# Patient Record
Sex: Male | Born: 1980 | Race: White | Hispanic: No | Smoking: Current every day smoker
Health system: Southern US, Community
[De-identification: ages and names within clinical notes are randomized; demographics above are authoritative.]

## PROBLEM LIST (undated history)

## (undated) DIAGNOSIS — N200 Calculus of kidney: Secondary | ICD-10-CM

## (undated) DIAGNOSIS — F17290 Nicotine dependence, other tobacco product, uncomplicated: Secondary | ICD-10-CM

## (undated) DIAGNOSIS — K219 Gastro-esophageal reflux disease without esophagitis: Secondary | ICD-10-CM

## (undated) DIAGNOSIS — Z9109 Other allergy status, other than to drugs and biological substances: Secondary | ICD-10-CM

## (undated) HISTORY — PX: HERNIA REPAIR: SHX51

## (undated) HISTORY — PX: SINOSCOPY: SHX187

## (undated) HISTORY — PX: FOOT SURGERY: SHX648

---
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
## 2002-06-29 ENCOUNTER — Emergency Department (HOSPITAL_COMMUNITY): Admission: EM | Admit: 2002-06-29 | Discharge: 2002-06-29 | Payer: Self-pay

## 2002-08-07 ENCOUNTER — Encounter: Payer: Self-pay | Admitting: Emergency Medicine

## 2002-08-07 ENCOUNTER — Emergency Department (HOSPITAL_COMMUNITY): Admission: EM | Admit: 2002-08-07 | Discharge: 2002-08-08 | Payer: Self-pay

## 2002-10-09 ENCOUNTER — Emergency Department (HOSPITAL_COMMUNITY): Admission: EM | Admit: 2002-10-09 | Discharge: 2002-10-09 | Payer: Self-pay | Admitting: *Deleted

## 2002-10-31 ENCOUNTER — Emergency Department (HOSPITAL_COMMUNITY): Admission: EM | Admit: 2002-10-31 | Discharge: 2002-10-31 | Payer: Self-pay | Admitting: Emergency Medicine

## 2002-11-04 ENCOUNTER — Emergency Department (HOSPITAL_COMMUNITY): Admission: EM | Admit: 2002-11-04 | Discharge: 2002-11-04 | Payer: Self-pay | Admitting: Emergency Medicine

## 2002-11-07 ENCOUNTER — Emergency Department (HOSPITAL_COMMUNITY): Admission: EM | Admit: 2002-11-07 | Discharge: 2002-11-07 | Payer: Self-pay | Admitting: Emergency Medicine

## 2004-07-11 ENCOUNTER — Emergency Department (HOSPITAL_COMMUNITY): Admission: EM | Admit: 2004-07-11 | Discharge: 2004-07-11 | Payer: Self-pay | Admitting: *Deleted

## 2005-11-27 HISTORY — PX: ABDOMINAL SURGERY: SHX537

## 2006-11-22 ENCOUNTER — Emergency Department (HOSPITAL_COMMUNITY): Admission: EM | Admit: 2006-11-22 | Discharge: 2006-11-22 | Payer: Self-pay | Admitting: Emergency Medicine

## 2007-09-04 ENCOUNTER — Inpatient Hospital Stay (HOSPITAL_COMMUNITY): Admission: EM | Admit: 2007-09-04 | Discharge: 2007-09-06 | Payer: Self-pay | Admitting: Emergency Medicine

## 2008-07-03 ENCOUNTER — Ambulatory Visit (HOSPITAL_COMMUNITY): Admission: EM | Admit: 2008-07-03 | Discharge: 2008-07-03 | Payer: Self-pay | Admitting: Emergency Medicine

## 2008-07-06 ENCOUNTER — Emergency Department (HOSPITAL_COMMUNITY): Admission: EM | Admit: 2008-07-06 | Discharge: 2008-07-06 | Payer: Self-pay | Admitting: Emergency Medicine

## 2009-01-27 ENCOUNTER — Emergency Department (HOSPITAL_COMMUNITY): Admission: EM | Admit: 2009-01-27 | Discharge: 2009-01-27 | Payer: Self-pay | Admitting: Emergency Medicine

## 2010-10-25 ENCOUNTER — Emergency Department (HOSPITAL_COMMUNITY): Admission: EM | Admit: 2010-10-25 | Discharge: 2010-10-25 | Payer: Self-pay | Admitting: Emergency Medicine

## 2011-02-26 ENCOUNTER — Emergency Department (HOSPITAL_COMMUNITY)
Admission: EM | Admit: 2011-02-26 | Discharge: 2011-02-26 | Disposition: A | Discharge status: Home / Self Care | Payer: Medicaid Other | Attending: Emergency Medicine | Admitting: Emergency Medicine

## 2011-02-26 ENCOUNTER — Emergency Department (HOSPITAL_COMMUNITY): Payer: Medicaid Other

## 2011-02-26 DIAGNOSIS — S62639B Displaced fracture of distal phalanx of unspecified finger, initial encounter for open fracture: Secondary | ICD-10-CM | POA: Insufficient documentation

## 2011-02-26 DIAGNOSIS — W540XXA Bitten by dog, initial encounter: Secondary | ICD-10-CM | POA: Insufficient documentation

## 2011-02-26 DIAGNOSIS — S61409A Unspecified open wound of unspecified hand, initial encounter: Secondary | ICD-10-CM | POA: Insufficient documentation

## 2011-02-26 DIAGNOSIS — Z23 Encounter for immunization: Secondary | ICD-10-CM | POA: Insufficient documentation

## 2011-03-01 ENCOUNTER — Encounter (HOSPITAL_COMMUNITY): Payer: Medicaid Other

## 2011-03-05 ENCOUNTER — Encounter (HOSPITAL_COMMUNITY): Payer: Medicaid Other

## 2011-03-12 ENCOUNTER — Ambulatory Visit (HOSPITAL_COMMUNITY): Payer: Self-pay

## 2011-04-11 NOTE — Op Note (Signed)
NAME:  Tom Humphrey, Tom Humphrey NO.:  [id number]   MEDICAL RECORD NO.:  [id number]          PATIENT TYPE:  EMS   LOCATION:  ED                           FACILITY:  [facility]   PHYSICIAN:  Jamison Neighbor, M.D.  DATE OF BIRTH:  01/15/1981   DATE OF PROCEDURE:  07/03/2008  DATE OF DISCHARGE:                               OPERATIVE REPORT   SERVICE:  Urology.   PREOPERATIVE DIAGNOSIS:  Possible right testicular torsion.   POSTOPERATIVE DIAGNOSIS:  1. Intermittent torsion.  2. Reactive right hydrocele.   PROCEDURE:  Right scrotal exploration, right hydrocelectomy, bilateral  orchiopexy.   SURGEON:  Jamison Neighbor, M.D.   ANESTHESIA:  General.   COMPLICATIONS:  None.   DRAINS:  A Penrose drain in the right hemiscrotum.   HISTORY:  This 30 year old male has had 3-4 days of pain in the right-  hand side.  The patient presented to the emergency room and underwent a  scrotal ultrasound that showed a reactive hydrocele but did show  diminished blood flow of the right-hand side.  It was felt that this  might represent testicular torsion.  The epididymis did not appear to be  palpably posterior but it was very difficult to tell due to the reactive  hydrocele.  Given the presence of pain and the abnormalities of the tes  on ultrasound it seemed appropriate to perform an exploration.  The  patient understands that he may end up with an orchiectomy and it also  possible he will not have torsion been have another problem such as  epididymitis; full informed consent was obtained.   PROCEDURE IN DETAIL:  After successful induction of general anesthesia  the patient was placed in the supine position and prepped with Betadine  and draped in the usual sterile fashion.  Incision was made in the right  hemiscrotum and was carried out through the Darto's layer until the  tunica vaginalis was identified.  Tunica vaginalis was opened and the  patient was found to have a significant  hydrocele.  This actually may be  due to a patent tunica vaginalis as the finger could be placed up and  through the internal ring.  There were no signs of an active hernia.  The testicle did appear somewhat blue although it clearly was viable and  it is does appear that the patient may have had intermittent torsion.  The testicle was placed in the appropriate position.  The hydrocele sac  was turned inside out and it was oversewn with 2-0 Vicryl.  An  orchiopexy was performed with 3-0 silk pexing at three separate  locations.  The Darto's layer was then closed with a running suture of 2-  0 Vicryl and the skin was closed with 3-0 chromic.  The patient was left  with two quarter inch Penrose drains exiting out through a single small  stab incision and this will be left in place until all swelling is gone.  A small incision was made on the left hemiscrotum and the tunica  vaginalis was opened.  There really was not much in the way of a  hydrocele.  The testicle was identified and was normal.  Orchiopexy was  also performed on this side because there was somewhat of a bell clapper  testicle and it was felt that it would be safer to make sure that side  did not twist either.  This was also  closed with 2-0 Vicryl and 3-0 chromic.  Collodion was placed on both  incisions.  The patient tolerated the procedure well and was taken to  the recovery room in good condition.  He will be sent home with  doxycycline and Tylox and a scrotal support and ice and return to see me  in followup.      Jamison Neighbor, M.D.  Electronically Signed     RJE/MEDQ  D:  07/03/2008  T:  07/04/2008  Job:  [id number]
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
## 2011-07-14 ENCOUNTER — Inpatient Hospital Stay (INDEPENDENT_AMBULATORY_CARE_PROVIDER_SITE_OTHER)
Admission: RE | Admit: 2011-07-14 | Discharge: 2011-07-14 | Disposition: A | Discharge status: Home / Self Care | Payer: Medicaid Other | Source: Ambulatory Visit | Attending: Family Medicine | Admitting: Family Medicine

## 2011-07-14 ENCOUNTER — Ambulatory Visit (INDEPENDENT_AMBULATORY_CARE_PROVIDER_SITE_OTHER): Payer: Medicaid Other

## 2011-07-14 DIAGNOSIS — IMO0002 Reserved for concepts with insufficient information to code with codable children: Secondary | ICD-10-CM

## 2011-07-14 DIAGNOSIS — S058X9A Other injuries of unspecified eye and orbit, initial encounter: Secondary | ICD-10-CM

## 2011-08-25 LAB — URINALYSIS, ROUTINE W REFLEX MICROSCOPIC
Hgb urine dipstick: NEGATIVE
Protein, ur: NEGATIVE
Specific Gravity, Urine: 1.023
Urobilinogen, UA: 0.2

## 2011-08-25 LAB — CBC
HCT: 41.8
Hemoglobin: 14.4
RBC: 4.74

## 2011-08-25 LAB — BASIC METABOLIC PANEL
CO2: 28
Calcium: 9.2
Creatinine, Ser: 1.07
Glucose, Bld: 84

## 2011-08-25 LAB — DIFFERENTIAL
Basophils Absolute: 0
Basophils Relative: 0
Monocytes Absolute: 1.3 — ABNORMAL HIGH
Monocytes Relative: 11
Neutro Abs: 7
Neutrophils Relative %: 59

## 2011-09-07 LAB — DIFFERENTIAL
Eosinophils Absolute: 0.1
Neutro Abs: 7.8 — ABNORMAL HIGH

## 2011-09-07 LAB — CBC
HCT: 41.6
MCHC: 34.7
Platelets: 223
RBC: 4.78
RDW: 12.9

## 2011-09-07 LAB — CULTURE, BLOOD (ROUTINE X 2)

## 2011-09-07 LAB — BASIC METABOLIC PANEL
BUN: 10
CO2: 26
GFR calc Af Amer: >60
Sodium: 138

## 2011-10-02 ENCOUNTER — Emergency Department (HOSPITAL_COMMUNITY)
Admission: EM | Admit: 2011-10-02 | Discharge: 2011-10-02 | Discharge status: Against Medical Advice | Payer: Medicaid Other | Attending: Emergency Medicine | Admitting: Emergency Medicine

## 2011-10-02 DIAGNOSIS — R109 Unspecified abdominal pain: Secondary | ICD-10-CM | POA: Insufficient documentation

## 2011-11-02 ENCOUNTER — Other Ambulatory Visit: Payer: Self-pay | Admitting: Family Medicine

## 2011-11-02 DIAGNOSIS — N509 Disorder of male genital organs, unspecified: Secondary | ICD-10-CM

## 2011-11-06 ENCOUNTER — Ambulatory Visit
Admission: RE | Admit: 2011-11-06 | Discharge: 2011-11-06 | Disposition: A | Discharge status: Home / Self Care | Payer: Medicaid Other | Source: Ambulatory Visit | Attending: Family Medicine | Admitting: Family Medicine

## 2011-11-06 DIAGNOSIS — N509 Disorder of male genital organs, unspecified: Secondary | ICD-10-CM

## 2011-11-10 ENCOUNTER — Emergency Department (HOSPITAL_COMMUNITY)
Admission: EM | Admit: 2011-11-10 | Discharge: 2011-11-10 | Disposition: A | Discharge status: Home / Self Care | Payer: Medicaid Other | Attending: Emergency Medicine | Admitting: Emergency Medicine

## 2011-11-10 DIAGNOSIS — K4090 Unilateral inguinal hernia, without obstruction or gangrene, not specified as recurrent: Secondary | ICD-10-CM | POA: Insufficient documentation

## 2011-11-10 DIAGNOSIS — F17200 Nicotine dependence, unspecified, uncomplicated: Secondary | ICD-10-CM | POA: Insufficient documentation

## 2011-11-10 LAB — DIFFERENTIAL
Basophils Absolute: 0 10*3/uL (ref 0.0–0.1)
Basophils Relative: 0 % (ref 0–1)
Eosinophils Absolute: 0.5 10*3/uL (ref 0.0–0.7)
Eosinophils Relative: 4 % (ref 0–5)
Lymphocytes Relative: 35 % (ref 12–46)

## 2011-11-10 LAB — CBC
HCT: 42.6 % (ref 39.0–52.0)
MCHC: 35.7 g/dL (ref 30.0–36.0)
MCV: 86.9 fL (ref 78.0–100.0)
Platelets: 237 10*3/uL (ref 150–400)
RBC: 4.9 MIL/uL (ref 4.22–5.81)
WBC: 11.9 10*3/uL — ABNORMAL HIGH (ref 4.0–10.5)

## 2011-11-10 LAB — BASIC METABOLIC PANEL
CO2: 27 mEq/L (ref 19–32)
Chloride: 100 mEq/L (ref 96–112)
GFR calc non Af Amer: >90 mL/min (ref >90)
Glucose, Bld: 97 mg/dL (ref 70–99)
Potassium: 3.4 mEq/L — ABNORMAL LOW (ref 3.5–5.1)
Sodium: 136 mEq/L (ref 135–145)

## 2011-11-10 MED ORDER — HYDROCODONE-ACETAMINOPHEN 5-500 MG PO TABS: 1.0000 | ORAL_TABLET | Freq: Four times a day (QID) | ORAL | Status: AC | PRN

## 2011-11-10 MED ORDER — SODIUM CHLORIDE 0.9 % IV BOLUS (SEPSIS)
1000.0000 mL | Freq: Once | INTRAVENOUS | Status: AC
  Administered 2011-11-10: 1000 mL via INTRAVENOUS

## 2011-11-10 MED ORDER — ONDANSETRON HCL 4 MG/2ML IJ SOLN
4.0000 mg | Freq: Once | INTRAMUSCULAR | Status: AC
  Administered 2011-11-10: 4 mg via INTRAVENOUS
  Filled 2011-11-10: qty 2

## 2011-11-10 MED ORDER — HYDROMORPHONE HCL PF 1 MG/ML IJ SOLN
1.0000 mg | Freq: Once | INTRAMUSCULAR | Status: AC
  Administered 2011-11-10: 1 mg via INTRAVENOUS
  Filled 2011-11-10: qty 1

## 2011-11-10 MED ORDER — LORAZEPAM 1 MG PO TABS
1.0000 mg | ORAL_TABLET | Freq: Once | ORAL | Status: AC
  Administered 2011-11-10: 1 mg via ORAL
  Filled 2011-11-10: qty 1

## 2011-11-10 NOTE — ED Provider Notes (Signed)
History     CSN: [id number] Arrival date & time: 11/10/2011  4:09 AM   First MD Initiated Contact with Patient 11/10/11 0404      Chief Complaint  Patient presents with  . Groin Swelling    (Consider location/radiation/quality/duration/timing/severity/associated sxs/prior treatment) The history is provided by the patient.   onset tonight after going to bed. Location right testicle. Quality is sharp pain. Severe pain. No radiation. Constant since onset. Was evaluated for similar symptoms are not as severe about 4 days ago for an ultrasound that was reported to him as normal. He has a history of torsion 2009 the cause him elevated concerned tonight.  No fevers or chills. No nausea or vomiting. No recalled trauma. No dysuria, urgency or frequency. No hematuria.  History reviewed. No pertinent past medical history.  History reviewed. No pertinent past surgical history.  History reviewed. No pertinent family history.  History  Substance Use Topics  . Smoking status: Current Some Day Smoker  . Smokeless tobacco: Not on file  . Alcohol Use: No      Review of Systems  Constitutional: Negative for fever and chills.  HENT: Negative for sore throat, neck pain and neck stiffness.   Eyes: Negative for pain.  Respiratory: Negative for shortness of breath.   Cardiovascular: Negative for chest pain.  Gastrointestinal: Negative for vomiting and abdominal pain.  Genitourinary: Positive for scrotal swelling. Negative for dysuria.  Musculoskeletal: Negative for back pain.  Skin: Negative for rash.  Neurological: Negative for headaches.  All other systems reviewed and are negative.    Allergies  Codeine  Home Medications  No current outpatient prescriptions on file.  BP 108/70  Pulse 78  Temp(Src) 97.8 F (36.6 C) (Oral)  Resp 16  Ht 6\' 2"  (1.88 m)  Wt 220 lb (99.791 kg)  BMI 28.25 kg/m2  SpO2 97%  Physical Exam  Constitutional: He is oriented to person, place, and time.  He appears well-developed and well-nourished.  HENT:  Head: Normocephalic and atraumatic.  Eyes: Conjunctivae and EOM are normal. Pupils are equal, round, and reactive to light.  Neck: Trachea normal. Neck supple. No thyromegaly present.  Cardiovascular: Normal rate, regular rhythm, S1 normal, S2 normal and normal pulses.     No systolic murmur is present   No diastolic murmur is present  Pulses:      Radial pulses are 2+ on the right side, and 2+ on the left side.  Pulmonary/Chest: Effort normal and breath sounds normal. He has no wheezes. He has no rhonchi. He has no rales. He exhibits no tenderness.  Abdominal: Soft. Normal appearance and bowel sounds are normal. There is no tenderness. There is no CVA tenderness and negative Murphy's sign.  Genitourinary: Penis normal.       Right scrotum grossly enlarged with hernia present. Tender to palpation and unable to reduce it on initial evaluation.  Neurological: He is alert and oriented to person, place, and time. He has normal strength. No cranial nerve deficit or sensory deficit. GCS eye subscore is 4. GCS verbal subscore is 5. GCS motor subscore is 6.  Skin: Skin is warm and dry. No rash noted. He is not diaphoretic.  Psychiatric: His speech is normal.       Cooperative and appropriate    ED Course  Procedures (including critical care time)  Labs Reviewed  CBC - Abnormal; Notable for the following:    WBC 11.9 (*)    All other components within normal limits  DIFFERENTIAL -  Abnormal; Notable for the following:    Lymphs Abs 4.2 (*)    Monocytes Absolute 1.1 (*)    All other components within normal limits  BASIC METABOLIC PANEL  LACTIC ACID, PLASMA   Hernia attempted to be reduced without success.   5:08 AM case d/w DR Leticia Penna on call for [facility], recs ativan in addition to pain control, continue trendelenburg and measures to relax patient.  He will be in to evaluate patient. Lactate pending.   6:43 AM after dilaudid x 2, ativan,  trendelenburg and labs reviewed. I was able to reduce the hernia with pain improved.   MDM   Right inguinal hernia, initially unable to reduce. After serial evaluations, pain medications as above and Ativan hernia in reducing.  Plan outpatient followup with Dr. Suzette Battiest, pain medications as needed, hernia precautions and instructions to stay well hydrated and avoid constipation.      Tom Nielsen, MD 11/10/11 [id number]

## 2011-11-10 NOTE — ED Notes (Signed)
Pt c/o pain and swelling to right testicle since last night, states had surgery to same in 2009

## 2011-12-20 ENCOUNTER — Emergency Department (HOSPITAL_COMMUNITY)
Admission: EM | Admit: 2011-12-20 | Discharge: 2011-12-20 | Disposition: A | Discharge status: Home / Self Care | Payer: Medicaid Other | Attending: Emergency Medicine | Admitting: Emergency Medicine

## 2011-12-20 ENCOUNTER — Encounter (HOSPITAL_COMMUNITY): Payer: Self-pay

## 2011-12-20 DIAGNOSIS — R109 Unspecified abdominal pain: Secondary | ICD-10-CM | POA: Insufficient documentation

## 2011-12-20 DIAGNOSIS — F17200 Nicotine dependence, unspecified, uncomplicated: Secondary | ICD-10-CM | POA: Insufficient documentation

## 2011-12-20 DIAGNOSIS — K469 Unspecified abdominal hernia without obstruction or gangrene: Secondary | ICD-10-CM | POA: Insufficient documentation

## 2011-12-20 LAB — BASIC METABOLIC PANEL
CO2: 28 mEq/L (ref 19–32)
Chloride: 104 mEq/L (ref 96–112)
Glucose, Bld: 102 mg/dL — ABNORMAL HIGH (ref 70–99)
Potassium: 4 mEq/L (ref 3.5–5.1)
Sodium: 139 mEq/L (ref 135–145)

## 2011-12-20 LAB — CBC
HCT: 44.7 % (ref 39.0–52.0)
Hemoglobin: 15.2 g/dL (ref 13.0–17.0)
RBC: 5.21 MIL/uL (ref 4.22–5.81)

## 2011-12-20 MED ORDER — HYDROMORPHONE HCL PF 1 MG/ML IJ SOLN
1.0000 mg | Freq: Once | INTRAMUSCULAR | Status: AC
  Administered 2011-12-20: 1 mg via INTRAVENOUS
  Filled 2011-12-20: qty 1

## 2011-12-20 MED ORDER — ONDANSETRON HCL 4 MG/2ML IJ SOLN
4.0000 mg | Freq: Once | INTRAMUSCULAR | Status: AC
  Administered 2011-12-20: 4 mg via INTRAVENOUS
  Filled 2011-12-20: qty 2

## 2011-12-20 MED ORDER — HYDROCODONE-ACETAMINOPHEN 5-325 MG PO TABS: 1.0000 | ORAL_TABLET | ORAL | Status: AC | PRN

## 2011-12-20 MED ORDER — OXYCODONE-ACETAMINOPHEN 5-325 MG PO TABS: 2.0000 | ORAL_TABLET | ORAL | Status: AC | PRN

## 2011-12-20 MED ORDER — SODIUM CHLORIDE 0.9 % IV SOLN
Freq: Once | INTRAVENOUS | Status: AC
  Administered 2011-12-20: 15:00:00 via INTRAVENOUS

## 2011-12-20 NOTE — ED Provider Notes (Addendum)
History     CSN: [id number]  Arrival date & time 12/20/11  1228   First MD Initiated Contact with Patient 12/20/11 1343      Chief Complaint  Patient presents with  . Abdominal Pain    (Consider location/radiation/quality/duration/timing/severity/associated sxs/prior treatment) HPI Tom Humphrey is a 31 y.o. male who presents to the Emergency Department complaining of right sided hernia. Patient has been seen by Dr. Leticia Penna, surgeon and is scheduled for elective surgery 01/17/12. Was coughing last night with an increase in scrotal swelling and pain.  History reviewed. No pertinent past medical history.  Past Surgical History  Procedure Date  . Hand surgery   . Foot surgery   . Abdominal surgery     No family history on file.  History  Substance Use Topics  . Smoking status: Current Some Day Smoker  . Smokeless tobacco: Not on file  . Alcohol Use: No      Review of Systems 10 Systems reviewed and are negative for acute change except as noted in the HPI. Allergies  Codeine  Home Medications  No current outpatient prescriptions on file.  BP 110/76  Pulse 69  Temp(Src) 97.6 F (36.4 C) (Oral)  Resp 20  Ht 6\' 3"  (1.905 m)  Wt 220 lb (99.791 kg)  BMI 27.50 kg/m2  SpO2 99%  Physical Exam  Nursing note and vitals reviewed. Constitutional: He is oriented to person, place, and time. He appears well-developed and well-nourished. He appears distressed.  HENT:  Head: Normocephalic.  Right Ear: External ear normal.  Left Ear: External ear normal.  Mouth/Throat: Oropharynx is clear and moist.  Eyes: EOM are normal. Pupils are equal, round, and reactive to light.  Neck: Normal range of motion. Neck supple.  Cardiovascular: Normal rate, normal heart sounds and intact distal pulses.   Pulmonary/Chest: Effort normal and breath sounds normal.  Abdominal: Soft. Bowel sounds are normal.  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  Musculoskeletal: Normal range of motion.  Neurological: He is alert and oriented to person, place, and time. He has normal reflexes.  Skin: Skin is warm and dry.    ED Course  Procedures (including critical care time) Results for orders placed during the hospital encounter of 12/20/11  CBC      Component Value Range   WBC 9.3  4.0 - 10.5 (K/uL)   RBC 5.21  4.22 - 5.81 (MIL/uL)   Hemoglobin 15.2  13.0 - 17.0 (g/dL)   HCT 40.9  81.1 - 91.4 (%)   MCV 85.8  78.0 - 100.0 (fL)   MCH 29.2  26.0 - 34.0 (pg)   MCHC 34.0  30.0 - 36.0 (g/dL)   RDW 78.2  95.6 - 21.3 (%)   Platelets 254  150 - 400 (K/uL)  BASIC METABOLIC PANEL      Component Value Range   Sodium 139  135 - 145 (mEq/L)   Potassium 4.0  3.5 - 5.1 (mEq/L)   Chloride 104  96 - 112 (mEq/L)   CO2 28  19 - 32 (mEq/L)   Glucose, Bld 102 (*) 70 - 99 (mg/dL)   BUN 10  6 - 23 (mg/dL)   Creatinine, Ser 0.86  0.50 - 1.35 (mg/dL)   Calcium 9.6  8.4 - 57.8 (mg/dL)   GFR calc non Af Amer >90  >90 (mL/min)   GFR  calc Af Amer >90  >90 (mL/min)   1512 Spoke with Dr. Lovell Sheehan. He advised we could contact Dr. Caesar Bookman and the patient can call the office for arrangement of earlier surgical appointment. 1540 Spoke with Dr. Caesar Bookman who advised patient can call for earlier surgical date.   MDM  Patient with indirect hernia on the right, scheduled for elective surgery 01/17/12. Here with increased pain and increased swelling to the scrotum. Patient to call the office to set up for a surgical date earlier that the current scheduled one. Given IVF, analgesics, and antiemetic.Pt feels improved after observation and/or treatment in ED.Pt stable in ED with no significant deterioration in condition.The patient appears reasonably screened and/or stabilized for discharge and I doubt any other medical condition or other [facility] requiring further screening, evaluation, or treatment  in the ED at this time prior to discharge.  MDM Reviewed: nursing note and vitals Interpretation: labs Consults: general surgery         Nicoletta Dress. Colon Branch, MD 12/20/11 1635  Nicoletta Dress. Colon Branch, MD 01/16/12 1558

## 2011-12-20 NOTE — ED Notes (Signed)
Pain in left side of scrotum, states he is scheduled for hernia repair next month, states area is more swollen onset last night

## 2011-12-20 NOTE — ED Notes (Signed)
Pt c/o rlq pain since last night.  Reports history of hernia.  Denies any n/v/d.  Says is supposed to have surgery for hernia feb 20.

## 2011-12-20 NOTE — ED Notes (Signed)
Patient states his pain is a 10, although he is eating and drinking without difficulty and asking for refills. Advised to see his physician for recheck and possible to move surgery date up

## 2012-01-10 ENCOUNTER — Encounter (HOSPITAL_COMMUNITY): Payer: Self-pay

## 2012-01-10 ENCOUNTER — Encounter (HOSPITAL_COMMUNITY)
Admission: RE | Admit: 2012-01-10 | Discharge: 2012-01-10 | Disposition: A | Discharge status: Home / Self Care | Payer: Medicaid Other | Source: Ambulatory Visit | Attending: General Surgery | Admitting: General Surgery

## 2012-01-10 HISTORY — DX: Gastro-esophageal reflux disease without esophagitis: K21.9

## 2012-01-10 HISTORY — DX: Other allergy status, other than to drugs and biological substances: Z91.09

## 2012-01-10 LAB — CBC
HCT: 44.2 % (ref 39.0–52.0)
Hemoglobin: 15.4 g/dL (ref 13.0–17.0)
RBC: 5.13 MIL/uL (ref 4.22–5.81)
RDW: 13 % (ref 11.5–15.5)
WBC: 11.3 10*3/uL — ABNORMAL HIGH (ref 4.0–10.5)

## 2012-01-10 LAB — DIFFERENTIAL
Basophils Absolute: 0.1 10*3/uL (ref 0.0–0.1)
Lymphocytes Relative: 30 % (ref 12–46)
Lymphs Abs: 3.3 10*3/uL (ref 0.7–4.0)
Monocytes Absolute: 0.9 10*3/uL (ref 0.1–1.0)
Neutro Abs: 6.6 10*3/uL (ref 1.7–7.7)

## 2012-01-10 LAB — SURGICAL PCR SCREEN: Staphylococcus aureus: POSITIVE — AB

## 2012-01-10 NOTE — Patient Instructions (Addendum)
22 Tom Humphrey  01/10/2012   Your procedure is scheduled on:  01/17/12  Report to [facility] at 06:15 AM.  Call this number if you have problems the morning of surgery: [phone]   Remember:   Do not eat food:After Midnight.  May have clear liquids:until Midnight .  Clear liquids include soda, tea, black coffee, apple or grape juice, broth.  Take these medicines the morning of surgery with A SIP OF WATER: Ranitidine.   Do not wear jewelry, make-up or nail polish.  Do not wear lotions, powders, or perfumes. You may wear deodorant.  Do not shave 48 hours prior to surgery.  Do not bring valuables to the hospital.  Contacts, dentures or bridgework may not be worn into surgery.  Leave suitcase in the car. After surgery it may be brought to your room.  For patients admitted to the hospital, checkout time is 11:00 AM the day of discharge.   Patients discharged the day of surgery will not be allowed to drive home.  Name and phone number of your driver:   Special Instructions: CHG Shower Use Special Wash: 1/2 bottle night before surgery and 1/2 bottle morning of surgery.   Please read over the following fact sheets that you were given: Pain Booklet, MRSA Information, Surgical Site Infection Prevention, Anesthesia Post-op Instructions and Care and Recovery After Surgery    Hernia, Surgical Repair A hernia occurs when an internal organ pushes out through a weak spot in the belly (abdominal) wall muscles. Hernias commonly occur in the groin and around the navel. Hernias often can be pushed back into place (reduced). Most hernias tend to get worse over time. Problems occur when abdominal contents get stuck in the opening (incarcerated hernia). The blood supply gets cut off (strangulated hernia). This is an emergency and needs surgery. Otherwise, hernia repair can be an elective procedure. This means you can schedule this at your convenience when an emergency is not present. Because complications  can occur, if you decide to repair the hernia, it is best to do it soon. When it becomes an emergency procedure, there is increased risk of complications after surgery. CAUSES   Heavy lifting.   Obesity.   Prolonged coughing.   Straining to move your bowels.   Hernias can also occur through a cut (incision) by a surgeonafter an abdominal operation.  HOME CARE INSTRUCTIONS Before the repair:  Bed rest is not required. You may continue your normal activities, but avoid heavy lifting (more than 10 pounds) or straining. Cough gently. If you are a smoker, it is best to stop. Even the best hernia repair can break down with the continual strain of coughing.   Do not wear anything tight over your hernia. Do not try to keep it in with an outside bandage or truss. These can damage abdominal contents if they are trapped in the hernia sac.   Eat a normal diet. Avoid constipation. Straining over long periods of time to have a bowel movement will increase hernia size. It also can breakdown repairs. If you cannot do this with diet alone, laxatives or stool softeners may be used.  PRIOR TO SURGERY, SEEK IMMEDIATE MEDICAL CARE IF: You have problems (symptoms) of a trapped (incarcerated) hernia. Symptoms include:  An oral temperature above 102 F (38.9 C) develops, or as your caregiver suggests.   Increasing abdominal pain.   Feeling sick to your stomach(nausea) and vomiting.   You stop passing gas or stool.   The hernia is  stuck outside the abdomen, looks discolored, feels hard, or is tender.   You have any changes in your bowel habits or in the hernia that is unusual for you.  LET YOUR CAREGIVERS KNOW ABOUT THE FOLLOWING:  Allergies.   Medications taken including herbs, eye drops, over the counter medications, and creams.   Use of steroids (by mouth or creams).   Family or personal history of problems with anesthetics or Novocaine.   Possibility of pregnancy, if this applies.    Personal history of blood clots (thrombophlebitis).   Family or personal history of bleeding or blood problems.   Previous surgery.   Other health problems.  BEFORE THE PROCEDURE You should be present 1 hour prior to your procedure, or as directed by your caregiver.  AFTER THE PROCEDURE After surgery, you will be taken to the recovery area. A nurse will watch and check your progress there. Once you are awake, stable, and taking fluids well, you will be allowed to go home as long as there are no problems. Once home, an ice pack (wrapped in a light towel) applied to your operative site may help with discomfort. It may also keep the swelling down. Do not lift anything heavier than 10 pounds (4.55 kilograms). Take showers not baths. Do not drive while taking narcotics. Follow instructions as suggested by your caregiver.  SEEK IMMEDIATE MEDICAL CARE IF: After surgery:  There is redness, swelling, or increasing pain in the wound.   There is pus coming from the wound.   There is drainage from a wound lasting longer than 1 day.   An unexplained oral temperature above 102 F (38.9 C) develops.   You notice a foul smell coming from the wound or dressing.   There is a breaking open of a wound (edged not staying together) after the sutures have been removed.   You notice increasing pain in the shoulders (shoulder strap areas).   You develop dizzy episodes or fainting while standing.   You develop persistent nausea or vomiting.   You develop a rash.   You have difficulty breathing.   You develop any reaction or side effects to medications given.  MAKE SURE YOU:   Understand these instructions.   Will watch your condition.   Will get help right away if you are not doing well or get worse.  Document Released: 05/09/2001 Document Revised: 07/26/2011 Document Reviewed: 03/31/2008 [facility] Patient Information 2012 [place], [place].   PATIENT  INSTRUCTIONS POST-ANESTHESIA  IMMEDIATELY FOLLOWING SURGERY:  Do not drive or operate machinery for the first twenty four hours after surgery.  Do not make any important decisions for twenty four hours after surgery or while taking narcotic pain medications or sedatives.  If you develop intractable nausea and vomiting or a severe headache please notify your doctor immediately.  FOLLOW-UP:  Please make an appointment with your surgeon as instructed. You do not need to follow up with anesthesia unless specifically instructed to do so.  WOUND CARE INSTRUCTIONS (if applicable):  Keep a dry clean dressing on the anesthesia/puncture wound site if there is drainage.  Once the wound has quit draining you may leave it open to air.  Generally you should leave the bandage intact for twenty four hours unless there is drainage.  If the epidural site drains for more than 36-48 hours please call the anesthesia department.  QUESTIONS?:  Please feel free to call your physician or the hospital operator if you have any questions, and they will be happy to  assist you.     [facility] Anesthesia Department [address] [place] [phone]

## 2012-01-10 NOTE — H&P (Signed)
  NTS SOAP Note  Vital Signs:  Vitals as of: 11/30/2011: Systolic 109: Diastolic 76: Heart Rate 54: Temp 96.27F: Height 73ft 3in: Weight 220Lbs 0 Ounces: Pain Level 0: BMI 27  BMI : 27.5 kg/m2  Subjective: This 31 Years 73 Months old Male presents for of Groin bulge.  Noted a couple months.  Worse end of the day.  No significant pain.  No fever or chills.  No change in BM.  No nausea.  No signs or symptoms of strangulation or incarceration.  Review of Symptoms:  Constitutional:unremarkable Head:unremarkable Eyes:unremarkable Nose/Mouth/Throat:unremarkable Cardiovascular:unremarkable Respiratory:unremarkable as per HPI occassional painful urination Musculoskeletal:unremarkable Skin:unremarkable Breast:unremarkable Hematolgic/Lymphatic:unremarkable Allergic/Immunologic:unremarkable   Past Medical History:Obtained   Past Medical History  Surgical History: Right hand and ankle, prior varicocele repair. Medical Problems: none Psychiatric History: none Allergies: NKDA Medications: none   Social History:Obtained   Social History  Preferred Language: English ([place]) Race:  White Ethnicity: Not Hispanic / Latino Age: 31 Years 6 Months Marital Status:  S Alcohol: none Recreational drug(s): none   Smoking Status: Current every day smoker reviewed on 11/30/2011 Started Date: 11/27/1998 Packs per day: 1.00   Family History:Obtained   Family History  Is there a family history [id number]    Objective Information: General:Well appearing, well nourished in no distress. Skin:no rash or prominent lesions Head:Atraumatic; no masses; no abnormalities Eyes:conjunctiva clear, EOM intact, PERRL Mouth:Mucous membranes moist, no mucosal lesions. Neck:Supple without lymphadenopathy.  Heart:RRR, no murmur or gallop.  Normal S1, S2.  No S3, S4.  Lungs:CTA bilaterally, no wheezes, rhonchi,  rales.  Breathing unlabored. Abdomen:Soft, NT/ND, normal bowel sounds, no HSM, no masses.  No peritoneal signs.  Large right groin bulge.  Not easily reducible. Extremities:No deformities, clubbing, cyanosis, or edema.   Assessment:  Diagnosis &amp; Procedure: DiagnosisCode: 550.90, ProcedureCode: 47829,    Plan: Right inguinal hernia.  Discussed with patient surgical options.  Will proceed at patient's convenience.  Patient Education:Alternative treatments to surgery were discussed with patient (and family).Risks and benefits  of procedure were fully explained to the patient (and family) who gave informed consent. Patient/family questions were addressed.  Follow-up:Pending Surgery

## 2012-01-17 ENCOUNTER — Ambulatory Visit (HOSPITAL_COMMUNITY): Payer: Medicaid Other | Admitting: Anesthesiology

## 2012-01-17 ENCOUNTER — Encounter (HOSPITAL_COMMUNITY): Payer: Self-pay | Admitting: Anesthesiology

## 2012-01-17 ENCOUNTER — Ambulatory Visit (HOSPITAL_COMMUNITY)
Admission: RE | Admit: 2012-01-17 | Discharge: 2012-01-17 | Disposition: A | Discharge status: Home / Self Care | Payer: Medicaid Other | Source: Ambulatory Visit | Attending: General Surgery | Admitting: General Surgery

## 2012-01-17 ENCOUNTER — Encounter (HOSPITAL_COMMUNITY)
Admission: RE | Disposition: A | Discharge status: Home / Self Care | Payer: Self-pay | Source: Ambulatory Visit | Attending: General Surgery

## 2012-01-17 ENCOUNTER — Encounter (HOSPITAL_COMMUNITY): Payer: Self-pay | Admitting: *Deleted

## 2012-01-17 DIAGNOSIS — K4030 Unilateral inguinal hernia, with obstruction, without gangrene, not specified as recurrent: Secondary | ICD-10-CM | POA: Insufficient documentation

## 2012-01-17 DIAGNOSIS — Z01812 Encounter for preprocedural laboratory examination: Secondary | ICD-10-CM | POA: Insufficient documentation

## 2012-01-17 HISTORY — PX: INGUINAL HERNIA REPAIR: SHX194

## 2012-01-17 SURGERY — REPAIR, HERNIA, INGUINAL, ADULT
Anesthesia: General | Site: Groin | Laterality: Right | Wound class: Clean

## 2012-01-17 MED ORDER — PROPOFOL 10 MG/ML IV EMUL
INTRAVENOUS | Status: AC
  Filled 2012-01-17: qty 20

## 2012-01-17 MED ORDER — CELECOXIB 100 MG PO CAPS
400.0000 mg | ORAL_CAPSULE | Freq: Every day | ORAL | Status: AC
  Administered 2012-01-17: 400 mg via ORAL

## 2012-01-17 MED ORDER — ONDANSETRON HCL 4 MG/2ML IJ SOLN: 4.0000 mg | Freq: Once | INTRAMUSCULAR | Status: DC | PRN

## 2012-01-17 MED ORDER — FENTANYL CITRATE 0.05 MG/ML IJ SOLN
INTRAMUSCULAR | Status: DC | PRN
  Administered 2012-01-17 (×5): 50 ug via INTRAVENOUS

## 2012-01-17 MED ORDER — ONDANSETRON HCL 4 MG/2ML IJ SOLN
INTRAMUSCULAR | Status: AC
  Filled 2012-01-17: qty 2

## 2012-01-17 MED ORDER — LIDOCAINE HCL (PF) 1 % IJ SOLN
INTRAMUSCULAR | Status: AC
  Filled 2012-01-17: qty 5

## 2012-01-17 MED ORDER — GLYCOPYRROLATE 0.2 MG/ML IJ SOLN
INTRAMUSCULAR | Status: DC | PRN
  Administered 2012-01-17: .6 mg via INTRAVENOUS

## 2012-01-17 MED ORDER — BUPIVACAINE HCL (PF) 0.5 % IJ SOLN
INTRAMUSCULAR | Status: AC
  Filled 2012-01-17: qty 30

## 2012-01-17 MED ORDER — ROCURONIUM BROMIDE 100 MG/10ML IV SOLN
INTRAVENOUS | Status: DC | PRN
  Administered 2012-01-17: 25 mg via INTRAVENOUS
  Administered 2012-01-17: 10 mg via INTRAVENOUS
  Administered 2012-01-17: 5 mg via INTRAVENOUS

## 2012-01-17 MED ORDER — NEOSTIGMINE METHYLSULFATE 1 MG/ML IJ SOLN
INTRAMUSCULAR | Status: DC | PRN
  Administered 2012-01-17: 3 mg via INTRAVENOUS

## 2012-01-17 MED ORDER — ONDANSETRON HCL 4 MG/2ML IJ SOLN
4.0000 mg | Freq: Once | INTRAMUSCULAR | Status: AC
  Administered 2012-01-17: 4 mg via INTRAVENOUS

## 2012-01-17 MED ORDER — FENTANYL CITRATE 0.05 MG/ML IJ SOLN
INTRAMUSCULAR | Status: AC
  Administered 2012-01-17: 50 ug via INTRAVENOUS
  Filled 2012-01-17: qty 5

## 2012-01-17 MED ORDER — ROCURONIUM BROMIDE 50 MG/5ML IV SOLN
INTRAVENOUS | Status: AC
  Filled 2012-01-17: qty 1

## 2012-01-17 MED ORDER — ENOXAPARIN SODIUM 40 MG/0.4ML ~~LOC~~ SOLN
40.0000 mg | Freq: Once | SUBCUTANEOUS | Status: AC
  Administered 2012-01-17: 40 mg via SUBCUTANEOUS

## 2012-01-17 MED ORDER — ALBUTEROL SULFATE HFA 108 (90 BASE) MCG/ACT IN AERS
INHALATION_SPRAY | RESPIRATORY_TRACT | Status: AC
  Filled 2012-01-17: qty 6.7

## 2012-01-17 MED ORDER — MIDAZOLAM HCL 2 MG/2ML IJ SOLN
INTRAMUSCULAR | Status: AC
  Filled 2012-01-17: qty 2

## 2012-01-17 MED ORDER — CEFAZOLIN SODIUM 1-5 GM-% IV SOLN: 1.0000 g | INTRAVENOUS | Status: DC

## 2012-01-17 MED ORDER — SODIUM CHLORIDE 0.9 % IR SOLN
Status: DC | PRN
  Administered 2012-01-17: 1000 mL

## 2012-01-17 MED ORDER — MIDAZOLAM HCL 2 MG/2ML IJ SOLN
INTRAMUSCULAR | Status: AC
  Administered 2012-01-17: 2 mg via INTRAVENOUS
  Filled 2012-01-17: qty 2

## 2012-01-17 MED ORDER — PROPOFOL 10 MG/ML IV BOLUS
INTRAVENOUS | Status: DC | PRN
  Administered 2012-01-17: 180 mg via INTRAVENOUS

## 2012-01-17 MED ORDER — LIDOCAINE HCL 1 % IJ SOLN
INTRAMUSCULAR | Status: DC | PRN
  Administered 2012-01-17: 50 mg via INTRADERMAL

## 2012-01-17 MED ORDER — CEFAZOLIN SODIUM 1-5 GM-% IV SOLN
INTRAVENOUS | Status: DC | PRN
  Administered 2012-01-17: 1 g via INTRAVENOUS

## 2012-01-17 MED ORDER — LACTATED RINGERS IV SOLN
INTRAVENOUS | Status: DC
  Administered 2012-01-17: 08:00:00 via INTRAVENOUS

## 2012-01-17 MED ORDER — NEOSTIGMINE METHYLSULFATE 1 MG/ML IJ SOLN
INTRAMUSCULAR | Status: AC
  Filled 2012-01-17: qty 10

## 2012-01-17 MED ORDER — GLYCOPYRROLATE 0.2 MG/ML IJ SOLN
INTRAMUSCULAR | Status: AC
  Filled 2012-01-17: qty 2

## 2012-01-17 MED ORDER — MIDAZOLAM HCL 2 MG/2ML IJ SOLN
1.0000 mg | INTRAMUSCULAR | Status: DC | PRN
  Administered 2012-01-17 (×2): 2 mg via INTRAVENOUS

## 2012-01-17 MED ORDER — CELECOXIB 100 MG PO CAPS
ORAL_CAPSULE | ORAL | Status: AC
  Administered 2012-01-17: 400 mg via ORAL
  Filled 2012-01-17: qty 4

## 2012-01-17 MED ORDER — MUPIROCIN 2 % EX OINT
TOPICAL_OINTMENT | CUTANEOUS | Status: AC
  Filled 2012-01-17: qty 22

## 2012-01-17 MED ORDER — HYDROCODONE-ACETAMINOPHEN 5-325 MG PO TABS: 1.0000 | ORAL_TABLET | ORAL | Status: AC | PRN

## 2012-01-17 MED ORDER — CEFAZOLIN SODIUM 1-5 GM-% IV SOLN
INTRAVENOUS | Status: AC
  Filled 2012-01-17: qty 50

## 2012-01-17 MED ORDER — BUPIVACAINE HCL (PF) 0.5 % IJ SOLN
INTRAMUSCULAR | Status: DC | PRN
  Administered 2012-01-17: 10 mL

## 2012-01-17 MED ORDER — FENTANYL CITRATE 0.05 MG/ML IJ SOLN
25.0000 ug | INTRAMUSCULAR | Status: DC | PRN
  Administered 2012-01-17 (×2): 50 ug via INTRAVENOUS

## 2012-01-17 MED ORDER — ALBUTEROL SULFATE (2.5 MG/3ML) 0.083% IN NEBU
INHALATION_SOLUTION | RESPIRATORY_TRACT | Status: DC | PRN
  Administered 2012-01-17: 2.5 mg via RESPIRATORY_TRACT

## 2012-01-17 MED ORDER — ENOXAPARIN SODIUM 40 MG/0.4ML ~~LOC~~ SOLN
SUBCUTANEOUS | Status: AC
  Administered 2012-01-17: 40 mg via SUBCUTANEOUS
  Filled 2012-01-17: qty 0.4

## 2012-01-17 MED ORDER — FENTANYL CITRATE 0.05 MG/ML IJ SOLN
INTRAMUSCULAR | Status: AC
  Administered 2012-01-17: 50 ug via INTRAVENOUS
  Filled 2012-01-17: qty 2

## 2012-01-17 SURGICAL SUPPLY — 41 items
APL SKNCLS STERI-STRIP NONHPOA (GAUZE/BANDAGES/DRESSINGS) ×1
BAG HAMPER (MISCELLANEOUS) ×2 IMPLANT
BENZOIN TINCTURE PRP APPL 2/3 (GAUZE/BANDAGES/DRESSINGS) ×2 IMPLANT
CLOTH BEACON ORANGE TIMEOUT ST (SAFETY) ×2 IMPLANT
COVER LIGHT HANDLE STERIS (MISCELLANEOUS) ×4 IMPLANT
DECANTER SPIKE VIAL GLASS SM (MISCELLANEOUS) ×2 IMPLANT
DRAIN PENROSE 18X.75 LTX STRL (MISCELLANEOUS) ×2 IMPLANT
DURAPREP 26ML APPLICATOR (WOUND CARE) ×2 IMPLANT
ELECT REM PT RETURN 9FT ADLT (ELECTROSURGICAL) ×2
ELECTRODE REM PT RTRN 9FT ADLT (ELECTROSURGICAL) ×1 IMPLANT
FORMALIN 10 PREFIL 120ML (MISCELLANEOUS) ×1 IMPLANT
GLOVE BIOGEL PI IND STRL 7.5 (GLOVE) ×1 IMPLANT
GLOVE BIOGEL PI INDICATOR 7.5 (GLOVE) ×1
GLOVE ECLIPSE 6.5 STRL STRAW (GLOVE) ×1 IMPLANT
GLOVE ECLIPSE 7.0 STRL STRAW (GLOVE) ×3 IMPLANT
GLOVE EXAM NITRILE MD LF STRL (GLOVE) ×1 IMPLANT
GLOVE INDICATOR 7.0 STRL GRN (GLOVE) ×1 IMPLANT
GOWN STRL REIN XL XLG (GOWN DISPOSABLE) ×7 IMPLANT
INST SET MINOR GENERAL (KITS) ×2 IMPLANT
KIT ROOM TURNOVER APOR (KITS) ×2 IMPLANT
MANIFOLD NEPTUNE II (INSTRUMENTS) ×2 IMPLANT
MESH HERNIA 1.6X1.9 PLUG LRG (Mesh General) IMPLANT
MESH HERNIA PLUG LRG (Mesh General) ×1 IMPLANT
NDL HYPO 25X1 1.5 SAFETY (NEEDLE) ×1 IMPLANT
NEEDLE HYPO 25X1 1.5 SAFETY (NEEDLE) ×2 IMPLANT
NS IRRIG 1000ML POUR BTL (IV SOLUTION) ×2 IMPLANT
PACK MINOR (CUSTOM PROCEDURE TRAY) ×2 IMPLANT
PAD ARMBOARD 7.5X6 YLW CONV (MISCELLANEOUS) ×2 IMPLANT
SET BASIN LINEN APH (SET/KITS/TRAYS/PACK) ×2 IMPLANT
STRIP CLOSURE SKIN 1/2X4 (GAUZE/BANDAGES/DRESSINGS) ×2 IMPLANT
SUT ETHIBOND NAB MO 7 #0 18IN (SUTURE) ×2 IMPLANT
SUT MNCRL AB 4-0 PS2 18 (SUTURE) ×2 IMPLANT
SUT SILK 2 0 (SUTURE)
SUT SILK 2-0 18XBRD TIE 12 (SUTURE) IMPLANT
SUT VIC AB 2-0 CT1 27 (SUTURE) ×2
SUT VIC AB 2-0 CT1 TAPERPNT 27 (SUTURE) ×1 IMPLANT
SUT VIC AB 3-0 SH 27 (SUTURE) ×2
SUT VIC AB 3-0 SH 27X BRD (SUTURE) ×1 IMPLANT
SUT VICRYL AB 3 0 TIES (SUTURE) IMPLANT
SYR BULB IRRIGATION 50ML (SYRINGE) ×2 IMPLANT
SYR CONTROL 10ML LL (SYRINGE) ×2 IMPLANT

## 2012-01-17 NOTE — Anesthesia Preprocedure Evaluation (Addendum)
Anesthesia Evaluation  Patient identified by MRN, date of birth, ID band Patient awake    Reviewed: Allergy & Precautions, H&P , NPO status , Patient's Chart, lab work & pertinent test results  History of Anesthesia Complications Negative for: history of anesthetic complications  Airway Mallampati: II      Dental  (+) Teeth Intact   Pulmonary asthma (allergies) , Current Smoker (1/2 ppd),  clear to auscultation        Cardiovascular Regular Normal    Neuro/Psych    GI/Hepatic GERD-  Medicated and Controlled,  Endo/Other    Renal/GU      Musculoskeletal   Abdominal   Peds  Hematology   Anesthesia Other Findings   Reproductive/Obstetrics                           Anesthesia Physical Anesthesia Plan  ASA: II  Anesthesia Plan: General   Post-op Pain Management:    Induction: Intravenous, Rapid sequence and Cricoid pressure planned  Airway Management Planned: Oral ETT  Additional Equipment:   Intra-op Plan:   Post-operative Plan: Extubation in OR  Informed Consent: I have reviewed the patients History and Physical, chart, labs and discussed the procedure including the risks, benefits and alternatives for the proposed anesthesia with the patient or authorized representative who has indicated his/her understanding and acceptance.     Plan Discussed with:   Anesthesia Plan Comments:         Anesthesia Quick Evaluation

## 2012-01-17 NOTE — Addendum Note (Signed)
Addendum  created 01/17/12 0917 by Janissa Bertram, CRNA   Modules edited:Anesthesia Medication Administration    

## 2012-01-17 NOTE — Addendum Note (Signed)
Addendum  created 01/17/12 1610 by Glynn Octave, CRNA   Modules edited:Anesthesia Medication Administration

## 2012-01-17 NOTE — Interval H&P Note (Signed)
History and Physical Interval Note:  01/17/2012 7:51 AM  Tom Humphrey  has presented today for surgery, with the diagnosis of Inguinal hernia without mention of obstruction or gangrene, unilateral or unspecified, not specified as recurrent   The various methods of treatment have been discussed with the patient and family. After consideration of risks, benefits and other options for treatment, the patient has consented to  Procedure(s) (LRB): HERNIA REPAIR INGUINAL ADULT (Right) as a surgical intervention .  The patients' history has been reviewed, patient examined, no change in status, stable for surgery.  I have reviewed the patients' chart and labs.  Questions were answered to the patient's satisfaction.     [name] C

## 2012-01-17 NOTE — Anesthesia Procedure Notes (Signed)
Procedure Name: Intubation Date/Time: 01/17/2012 8:05 AM Performed by: Glynn Octave Pre-anesthesia Checklist: Patient identified, Patient being monitored, Timeout performed, Emergency Drugs available and Suction available Patient Re-evaluated:Patient Re-evaluated prior to inductionOxygen Delivery Method: Circle System Utilized Preoxygenation: Pre-oxygenation with 100% oxygen Intubation Type: IV induction, Rapid sequence and Cricoid Pressure applied Ventilation: Mask ventilation without difficulty Laryngoscope Size: Mac and 3 Grade View: Grade I Tube type: Oral Tube size: 7.0 mm Number of attempts: 1 Airway Equipment and Method: stylet Placement Confirmation: ETT inserted through vocal cords under direct vision,  positive ETCO2 and breath sounds checked- equal and bilateral Secured at: 22 cm Tube secured with: Tape Dental Injury: Teeth and Oropharynx as per pre-operative assessment

## 2012-01-17 NOTE — Anesthesia Postprocedure Evaluation (Signed)
  Anesthesia Post-op Note  Patient: Tom Humphrey  Procedure(s) Performed: Procedure(s) (LRB): HERNIA REPAIR INGUINAL ADULT (Right)  Patient Location: PACU  Anesthesia Type: General  Level of Consciousness: awake  Airway and Oxygen Therapy: Patient Spontanous Breathing and Patient connected to face mask oxygen  Post-op Pain: mild  Post-op Assessment: Post-op Vital signs reviewed, Patient's Cardiovascular Status Stable, Respiratory Function Stable, Patent Airway and No signs of Nausea or vomiting  Post-op Vital Signs: Reviewed and stable  Complications: No apparent anesthesia complications

## 2012-01-17 NOTE — Discharge Instructions (Signed)
Hernia Repair Care After These instructions give you information on caring for yourself after your procedure. Your doctor may also give you more specific instructions. Call your doctor if you have any problems or questions after your procedure. HOME CARE   You may have changes in your poops (bowel movements).   You may have loose or watery poop (diarrhea).   You may be not able to poop.   Your bowels will slowly get back to normal.   Do not eat any food that makes you sick to your stomach (nauseous). Eat small meals 4 to 6 times a day instead of 3 large ones.   Do not drink pop. It will give you gas.   Do not drink alcohol.   Do not lift anything heavier than 10 pounds. This is about the weight of a gallon of milk.   Do not do anything that makes you very tired for at least 6 weeks.   Do not get your wound wet for 2 days.   You may take a sponge bath during this time.   After 2 days you may take a shower. Gently pat your surgical cut (incision) dry with a towel. Do not rub it.   For men: You may have been given an athletic supporter (scrotal support) before you left the hospital. It holds your scrotum and testicles closer to your body so there is no strain on your wound. Wear the supporter until your doctor tells you that you do not need it anymore.  GET HELP RIGHT AWAY IF:  You have watery poop, or cannot poop for more than 3 days.   You feel sick to your stomach or throw up (vomit) more than 2 or 3 times.   You have temperature by mouth above 102 F (38.9 C).   You see redness or puffiness (swelling) around your wound.   You see yellowish white fluid (pus) coming from your wound.   You see a bulge or bump in your lower belly (abdomen) or near your groin.   You develop a rash, trouble breathing, or any other symptoms from medicines taken.  MAKE SURE YOU:  Understand these instructions.   Will watch your condition.   Will get help right away if your are not doing  well or get worse.  Document Released: 10/26/2008 Document Revised: 07/26/2011 Document Reviewed: 10/26/2008 [employer] Patient Information 2012 [employer]. 

## 2012-01-17 NOTE — Transfer of Care (Signed)
Immediate Anesthesia Transfer of Care Note  Patient: Tom Humphrey  Procedure(s) Performed: Procedure(s) (LRB): HERNIA REPAIR INGUINAL ADULT (Right)  Patient Location: PACU  Anesthesia Type: General  Level of Consciousness: awake  Airway & Oxygen Therapy: Patient Spontanous Breathing and Patient connected to face mask oxygen  Post-op Assessment: Report given to PACU RN  Post vital signs: Reviewed and stable  Complications: No apparent anesthesia complications

## 2012-01-17 NOTE — Op Note (Signed)
Patient:  Tom Humphrey  DOB:  [birth year]  MRN:  [id number]   Preop Diagnosis:  Right inguinal hernia, chronically incarcerated  Postop Diagnosis:  The same  Procedure:  Right inguinal hernia repair with mesh  Surgeon:  Dr. Tilford Pillar  Anes:  General endotracheal, 0.5% Sensorcaine plain for local  Indications:  Patient is a 31 year old male presented to my office with a history of right groin pain. Workup and evaluation demonstrated a large right inguinal hernia with herniated abdominal contents into the right scrotum. Risks benefits alternatives of a hernia repair were discussed at length the patient including but not limited to risk of bleeding, infection, infection and mesh requiring removal and subsequent repair, ischemic orchiditis, testicular loss, paresthesias, chronic pain, intraoperative cardiac and pulmonary events. Patient's questions and concerns are addressed the patient was consented for the planned procedure.  Procedure note:  Patient is taken to the OR to the supine position on the OR table time the general anesthetic is a Optician, dispensing. Once patient was asleep he was endotracheally needed by the nurse anesthetist. At this point his abdomen and groin was prepped with DuraPrep solution and draped in standard fashion. A skin incision was created in the right groin with a 10 blade scalpel. Additional dissection down to subcuticular tissues carried out using electrocautery including the division of Scarpa's fascia. This dissection is carried out down to the external oblique fascia. This is scored with a 15 blade scalpel and opened medially towards the external inguinal ring with Metzenbaum scissors. At this point the hernia sac and cord structures were bluntly dissected behind over the pubic tubercle with blunt digital dissection. A Penrose drain is placed to help with elevation of the cord structures and hernia sac into the field. At this point due to the inability to reduce the  contents within the hernia sac and did opt to enter into the hernia sac sharply. This is carried out using Metzenbaum scissors. Upon entering into the hernia sac it did identify omental fat. This all appeared healthy and viable. It was free from the distal attachment and reduced back into the abdominal cavity. Due to the large opening of the peritoneal defect it did opt to close his with a 0 Ethibond suture in a pursestring fashion. After closure of this I then placed a large mesh plug into the indirect defect. This was secured to the fascial edges with 0 Ethibond sutures in simple or fashion. At this point the mesh was brought to the field and was secured. It was pexed medially over the pubic tubercle inferiorly to the shelving portion of the inguinal ligament, superiorly to the conjoin tendon, and the keyhole defects were secured laterally around the cord structures all with 0 Ethibond sutures. The tails were tucked under the external oblique fascia. As quite pleased with the appearance of and Lahey of the mesh. The wound is irrigated with copious amount of sterile saline. The Penrose drain was removed and the course or for allowed to return back into the inguinal canal. A 2-0 Vicryl suture was utilized to reapproximate the external oblique fashion running continuous fashion. The local anesthetic is instilled. The deep subcuticular layers reapproximated using a 3-0 Vicryl and running continuous fashion. The skin edges were reapproximated using a 4-0 Monocryl in a running subcuticular suture. The skin was washed dried moist dry towel. Benzoin is applied around incision. Half-inch Steri-Strips are placed. The drapes removed the patient left come out of general anesthetic. Patient is transferred to the postanesthetic care  unit in stable condition. At the conclusion of procedure all instrument, sponge, needle counts are correct. Patient tolerated procedure extremely well.  Complications:  None  EBL:   Minimal  Specimen:  None

## 2012-01-19 ENCOUNTER — Encounter (HOSPITAL_COMMUNITY): Payer: Self-pay | Admitting: General Surgery

## 2012-02-09 ENCOUNTER — Emergency Department (HOSPITAL_COMMUNITY)
Admission: EM | Admit: 2012-02-09 | Discharge: 2012-02-09 | Disposition: A | Discharge status: Home / Self Care | Payer: Medicaid Other | Attending: Emergency Medicine | Admitting: Emergency Medicine

## 2012-02-09 ENCOUNTER — Encounter (HOSPITAL_COMMUNITY): Payer: Self-pay

## 2012-02-09 ENCOUNTER — Emergency Department (HOSPITAL_COMMUNITY): Payer: Medicaid Other

## 2012-02-09 DIAGNOSIS — M79609 Pain in unspecified limb: Secondary | ICD-10-CM | POA: Insufficient documentation

## 2012-02-09 DIAGNOSIS — M25429 Effusion, unspecified elbow: Secondary | ICD-10-CM | POA: Insufficient documentation

## 2012-02-09 DIAGNOSIS — Z79899 Other long term (current) drug therapy: Secondary | ICD-10-CM | POA: Insufficient documentation

## 2012-02-09 DIAGNOSIS — M25529 Pain in unspecified elbow: Secondary | ICD-10-CM | POA: Insufficient documentation

## 2012-02-09 DIAGNOSIS — M7989 Other specified soft tissue disorders: Secondary | ICD-10-CM | POA: Insufficient documentation

## 2012-02-09 DIAGNOSIS — S52123A Displaced fracture of head of unspecified radius, initial encounter for closed fracture: Secondary | ICD-10-CM | POA: Insufficient documentation

## 2012-02-09 DIAGNOSIS — T07XXXA Unspecified multiple injuries, initial encounter: Secondary | ICD-10-CM

## 2012-02-09 DIAGNOSIS — F17200 Nicotine dependence, unspecified, uncomplicated: Secondary | ICD-10-CM | POA: Insufficient documentation

## 2012-02-09 DIAGNOSIS — IMO0002 Reserved for concepts with insufficient information to code with codable children: Secondary | ICD-10-CM | POA: Insufficient documentation

## 2012-02-09 DIAGNOSIS — K219 Gastro-esophageal reflux disease without esophagitis: Secondary | ICD-10-CM | POA: Insufficient documentation

## 2012-02-09 MED ORDER — BACITRACIN ZINC 500 UNIT/GM EX OINT
TOPICAL_OINTMENT | CUTANEOUS | Status: AC
  Administered 2012-02-09: 23:00:00
  Filled 2012-02-09: qty 0.9

## 2012-02-09 MED ORDER — ONDANSETRON 8 MG PO TBDP
8.0000 mg | ORAL_TABLET | Freq: Once | ORAL | Status: AC
  Administered 2012-02-09: 8 mg via ORAL
  Filled 2012-02-09: qty 1

## 2012-02-09 MED ORDER — HYDROCODONE-ACETAMINOPHEN 5-325 MG PO TABS: ORAL_TABLET | ORAL | Status: DC

## 2012-02-09 MED ORDER — HYDROCODONE-ACETAMINOPHEN 5-325 MG PO TABS
2.0000 | ORAL_TABLET | Freq: Once | ORAL | Status: AC
  Administered 2012-02-09: 2 via ORAL
  Filled 2012-02-09: qty 2
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
## 2012-02-09 NOTE — Discharge Instructions (Signed)
Abrasions Abrasions are skin scrapes. Their treatment depends on how large and deep the abrasion is. Abrasions do not extend through all layers of the skin. A cut or lesion through all skin layers is called a laceration. HOME CARE INSTRUCTIONS   If you were given a dressing, change it at least once a day or as instructed by your caregiver. If the bandage sticks, soak it off with a solution of water or hydrogen peroxide.   Twice a day, wash the area with soap and water to remove all the cream/ointment. You may do this in a sink, under a tub faucet, or in a shower. Rinse off the soap and pat dry with a clean towel. Look for signs of infection (see below).   Reapply cream/ointment according to your caregiver's instruction. This will help prevent infection and keep the bandage from sticking. Telfa or gauze over the wound and under the dressing or wrap will also help keep the bandage from sticking.   If the bandage becomes wet, dirty, or develops a foul smell, change it as soon as possible.   Only take over-the-counter or prescription medicines for pain, discomfort, or fever as directed by your caregiver.  SEEK IMMEDIATE MEDICAL CARE IF:   Increasing pain in the wound.   Signs of infection develop: redness, swelling, surrounding area is tender to touch, or pus coming from the wound.   You have a fever.   Any foul smell coming from the wound or dressing.  Most skin wounds heal within ten days. Facial wounds heal faster. However, an infection may occur despite proper treatment. You should have the wound checked for signs of infection within 24 to 48 hours or sooner if problems arise. If you were not given a wound-check appointment, look closely at the wound yourself on the second day for early signs of infection listed above. MAKE SURE YOU:   Understand these instructions.   Will watch your condition.   Will get help right away if you are not doing well or get worse.  Document Released:  08/23/2005 Document Revised: 11/02/2011 Document Reviewed: 10/17/2011 [facility] Patient Information 2012 [place], [place].Elbow Fracture, Simple A fracture is a break in one of the bones.When fractures are not displaced or separated, they may be treated with only a sling or splint. The sling or splint may only be required for two to three weeks. In these cases, often the elbow is put through early range of motion exercises to prevent the elbow from getting stiff. DIAGNOSIS  The diagnosis (learning what is wrong) of a fractured elbow is made by x-ray. These may be required before and after the elbow is put into a splint or cast. X-rays are taken after to make sure the bone pieces have not moved. HOME CARE INSTRUCTIONS   Only take over-the-counter or prescription medicines for pain, discomfort, or fever as directed by your caregiver.   If you have a splint held on with an elastic wrap and your hand or fingers become numb or cold and blue, loosen the wrap and reapply more loosely. See your caregiver if there is no relief.   You may use ice for twenty minutes, four times per day, for the first two to three days.   Use your elbow as directed.   See your caregiver as directed. It is very important to keep all follow-up referrals and appointments in order to avoid any long-term problems with your elbow including chronic pain or stiffness.  SEEK IMMEDIATE MEDICAL CARE IF:  There is swelling or increasing pain in elbow.   You begin to lose feeling or experience numbness or tingling in your hand or fingers.   You develop swelling of the hand and fingers.   You get a cold or blue hand or fingers on affected side.  MAKE SURE YOU:   Understand these instructions.   Will watch your condition.   Will get help right away if you are not doing well or get worse.  Document Released: 11/07/2001 Document Revised: 11/02/2011 Document Reviewed: 09/28/2009 [facility] Patient Information 2012 [place], [place].
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
## 2012-02-14 ENCOUNTER — Encounter: Payer: Self-pay | Admitting: Orthopedic Surgery

## 2012-02-14 ENCOUNTER — Ambulatory Visit (INDEPENDENT_AMBULATORY_CARE_PROVIDER_SITE_OTHER): Payer: Medicaid Other | Admitting: Orthopedic Surgery

## 2012-02-14 ENCOUNTER — Ambulatory Visit (HOSPITAL_COMMUNITY)
Admission: RE | Admit: 2012-02-14 | Discharge: 2012-02-14 | Disposition: A | Discharge status: Home / Self Care | Payer: Medicaid Other | Source: Ambulatory Visit | Attending: Orthopedic Surgery | Admitting: Orthopedic Surgery

## 2012-02-14 ENCOUNTER — Other Ambulatory Visit: Payer: Self-pay | Admitting: Orthopedic Surgery

## 2012-02-14 VITALS — BP 110/60 | Ht 75.0 in | Wt 222.0 lb

## 2012-02-14 DIAGNOSIS — X58XXXA Exposure to other specified factors, initial encounter: Secondary | ICD-10-CM

## 2012-02-14 DIAGNOSIS — S52123A Displaced fracture of head of unspecified radius, initial encounter for closed fracture: Secondary | ICD-10-CM | POA: Insufficient documentation

## 2012-02-14 DIAGNOSIS — R52 Pain, unspecified: Secondary | ICD-10-CM

## 2012-02-14 MED ORDER — HYDROCODONE-ACETAMINOPHEN 5-325 MG PO TABS: ORAL_TABLET | ORAL | Status: AC
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
## 2012-02-14 NOTE — Patient Instructions (Addendum)
Exercises for elbow  

## 2012-11-16 DIAGNOSIS — R6883 Chills (without fever): Secondary | ICD-10-CM | POA: Insufficient documentation

## 2012-11-16 DIAGNOSIS — R0602 Shortness of breath: Secondary | ICD-10-CM | POA: Insufficient documentation

## 2012-11-16 DIAGNOSIS — K219 Gastro-esophageal reflux disease without esophagitis: Secondary | ICD-10-CM | POA: Insufficient documentation

## 2012-11-16 DIAGNOSIS — J189 Pneumonia, unspecified organism: Secondary | ICD-10-CM | POA: Insufficient documentation

## 2012-11-16 DIAGNOSIS — F17200 Nicotine dependence, unspecified, uncomplicated: Secondary | ICD-10-CM | POA: Insufficient documentation

## 2012-11-16 DIAGNOSIS — Z79899 Other long term (current) drug therapy: Secondary | ICD-10-CM | POA: Insufficient documentation

## 2012-11-17 ENCOUNTER — Emergency Department (HOSPITAL_COMMUNITY)
Admission: EM | Admit: 2012-11-17 | Discharge: 2012-11-17 | Disposition: A | Discharge status: Home / Self Care | Payer: Medicaid Other | Attending: Emergency Medicine | Admitting: Emergency Medicine

## 2012-11-17 ENCOUNTER — Emergency Department (HOSPITAL_COMMUNITY): Payer: Medicaid Other

## 2012-11-17 ENCOUNTER — Encounter (HOSPITAL_COMMUNITY): Payer: Self-pay | Admitting: Emergency Medicine

## 2012-11-17 ENCOUNTER — Emergency Department (HOSPITAL_COMMUNITY): Admit: 2012-11-17 | Discharge: 2012-11-17 | Disposition: A | Discharge status: Home / Self Care | Payer: Medicaid Other

## 2012-11-17 DIAGNOSIS — J189 Pneumonia, unspecified organism: Secondary | ICD-10-CM

## 2012-11-17 DIAGNOSIS — R059 Cough, unspecified: Secondary | ICD-10-CM | POA: Insufficient documentation

## 2012-11-17 DIAGNOSIS — R509 Fever, unspecified: Secondary | ICD-10-CM | POA: Insufficient documentation

## 2012-11-17 DIAGNOSIS — R05 Cough: Secondary | ICD-10-CM | POA: Insufficient documentation

## 2012-11-17 LAB — RAPID STREP SCREEN (MED CTR MEBANE ONLY): Streptococcus, Group A Screen (Direct): NEGATIVE

## 2012-11-17 MED ORDER — ALBUTEROL SULFATE HFA 108 (90 BASE) MCG/ACT IN AERS: 2.0000 | INHALATION_SPRAY | RESPIRATORY_TRACT | Status: DC | PRN

## 2012-11-17 MED ORDER — ALBUTEROL SULFATE (5 MG/ML) 0.5% IN NEBU
2.5000 mg | INHALATION_SOLUTION | Freq: Once | RESPIRATORY_TRACT | Status: AC
  Administered 2012-11-17: 2.5 mg via RESPIRATORY_TRACT
  Filled 2012-11-17: qty 0.5

## 2012-11-17 MED ORDER — LIDOCAINE HCL (PF) 1 % IJ SOLN
2.0000 mL | Freq: Once | INTRAMUSCULAR | Status: AC
  Administered 2012-11-17: 2 mL

## 2012-11-17 MED ORDER — AZITHROMYCIN 250 MG PO TABS
500.0000 mg | ORAL_TABLET | Freq: Once | ORAL | Status: AC
  Administered 2012-11-17: 500 mg via ORAL
  Filled 2012-11-17: qty 2

## 2012-11-17 MED ORDER — AZITHROMYCIN 250 MG PO TABS: ORAL_TABLET | ORAL | Status: DC

## 2012-11-17 MED ORDER — CEFTRIAXONE SODIUM 1 G IJ SOLR
1.0000 g | Freq: Once | INTRAMUSCULAR | Status: AC
  Administered 2012-11-17: 1 g via INTRAMUSCULAR
  Filled 2012-11-17: qty 10

## 2012-11-17 MED ORDER — IPRATROPIUM BROMIDE 0.02 % IN SOLN
0.5000 mg | Freq: Once | RESPIRATORY_TRACT | Status: AC
  Administered 2012-11-17: 0.5 mg via RESPIRATORY_TRACT
  Filled 2012-11-17: qty 2.5

## 2012-11-17 MED ORDER — DEXTROSE 5 % IV SOLN: 500.0000 mg | INTRAVENOUS | Status: DC

## 2012-11-17 MED ORDER — ALBUTEROL SULFATE HFA 108 (90 BASE) MCG/ACT IN AERS
2.0000 | INHALATION_SPRAY | RESPIRATORY_TRACT | Status: DC | PRN
  Administered 2012-11-17: 2 via RESPIRATORY_TRACT
  Filled 2012-11-17: qty 6.7

## 2012-11-17 NOTE — ED Notes (Signed)
Given water at patient request

## 2012-11-17 NOTE — ED Notes (Signed)
Patient is resting comfortably. 

## 2012-11-17 NOTE — ED Notes (Signed)
02 sats 91-93%.  Inhaler treatment given per RT.
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
## 2012-11-17 NOTE — ED Notes (Signed)
Patient states he was treated for flu 6 days ago and symptoms are worse. Complaining of shortness of breath and coughing green sputum.

## 2012-11-22 NOTE — ED Provider Notes (Signed)
Medical screening examination/treatment/procedure(s) were performed by non-physician practitioner and as supervising physician I was immediately available for consultation/collaboration.  Nicoletta Dress. Colon Branch, MD 11/22/12 [phone]

## 2014-03-06 ENCOUNTER — Observation Stay (HOSPITAL_COMMUNITY): Payer: Medicaid Other

## 2014-03-06 ENCOUNTER — Encounter (HOSPITAL_COMMUNITY): Payer: Self-pay | Admitting: Emergency Medicine

## 2014-03-06 ENCOUNTER — Inpatient Hospital Stay (HOSPITAL_COMMUNITY)
Admission: EM | Admit: 2014-03-06 | Discharge: 2014-03-10 | DRG: 694 | Disposition: A | Discharge status: Home / Self Care | Payer: Medicaid Other | Attending: Internal Medicine | Admitting: Internal Medicine

## 2014-03-06 ENCOUNTER — Emergency Department (HOSPITAL_COMMUNITY): Payer: Medicaid Other

## 2014-03-06 DIAGNOSIS — Z800 Family history of malignant neoplasm of digestive organs: Secondary | ICD-10-CM

## 2014-03-06 DIAGNOSIS — N179 Acute kidney failure, unspecified: Secondary | ICD-10-CM

## 2014-03-06 DIAGNOSIS — N35919 Unspecified urethral stricture, male, unspecified site: Secondary | ICD-10-CM | POA: Diagnosis present

## 2014-03-06 DIAGNOSIS — N1330 Unspecified hydronephrosis: Secondary | ICD-10-CM

## 2014-03-06 DIAGNOSIS — F17200 Nicotine dependence, unspecified, uncomplicated: Secondary | ICD-10-CM | POA: Diagnosis present

## 2014-03-06 DIAGNOSIS — K219 Gastro-esophageal reflux disease without esophagitis: Secondary | ICD-10-CM

## 2014-03-06 DIAGNOSIS — Z87442 Personal history of urinary calculi: Secondary | ICD-10-CM

## 2014-03-06 DIAGNOSIS — N201 Calculus of ureter: Principal | ICD-10-CM | POA: Diagnosis present

## 2014-03-06 DIAGNOSIS — N200 Calculus of kidney: Secondary | ICD-10-CM | POA: Diagnosis present

## 2014-03-06 DIAGNOSIS — Z87891 Personal history of nicotine dependence: Secondary | ICD-10-CM

## 2014-03-06 DIAGNOSIS — J309 Allergic rhinitis, unspecified: Secondary | ICD-10-CM

## 2014-03-06 DIAGNOSIS — D72829 Elevated white blood cell count, unspecified: Secondary | ICD-10-CM

## 2014-03-06 DIAGNOSIS — S52123A Displaced fracture of head of unspecified radius, initial encounter for closed fracture: Secondary | ICD-10-CM

## 2014-03-06 DIAGNOSIS — Z833 Family history of diabetes mellitus: Secondary | ICD-10-CM

## 2014-03-06 HISTORY — DX: Nicotine dependence, other tobacco product, uncomplicated: F17.290

## 2014-03-06 HISTORY — DX: Calculus of kidney: N20.0

## 2014-03-06 LAB — CBC
HEMATOCRIT: 44.3 % (ref 39.0–52.0)
Hemoglobin: 15 g/dL (ref 13.0–17.0)
MCH: 28.8 pg (ref 26.0–34.0)
MCHC: 33.9 g/dL (ref 30.0–36.0)
MCV: 85 fL (ref 78.0–100.0)
PLATELETS: 246 10*3/uL (ref 150–400)
RBC: 5.21 MIL/uL (ref 4.22–5.81)
RDW: 13.1 % (ref 11.5–15.5)
WBC: 11.9 10*3/uL — AB (ref 4.0–10.5)

## 2014-03-06 LAB — BASIC METABOLIC PANEL
BUN: 22 mg/dL (ref 6–23)
CHLORIDE: 100 meq/L (ref 96–112)
CO2: 25 mEq/L (ref 19–32)
Calcium: 9.8 mg/dL (ref 8.4–10.5)
Creatinine, Ser: 1.27 mg/dL (ref 0.50–1.35)
GFR calc non Af Amer: 75 mL/min — ABNORMAL LOW (ref >90)
GFR, EST AFRICAN AMERICAN: 86 mL/min — AB (ref >90)
Glucose, Bld: 113 mg/dL — ABNORMAL HIGH (ref 70–99)
POTASSIUM: 4.1 meq/L (ref 3.7–5.3)
Sodium: 140 mEq/L (ref 137–147)

## 2014-03-06 MED ORDER — ONDANSETRON HCL 4 MG/2ML IJ SOLN
4.0000 mg | Freq: Once | INTRAMUSCULAR | Status: AC
  Administered 2014-03-06: 4 mg via INTRAVENOUS
  Filled 2014-03-06: qty 2

## 2014-03-06 MED ORDER — MORPHINE SULFATE 4 MG/ML IJ SOLN
INTRAMUSCULAR | Status: AC
  Filled 2014-03-06: qty 2

## 2014-03-06 MED ORDER — ACETAMINOPHEN 650 MG RE SUPP
650.0000 mg | Freq: Four times a day (QID) | RECTAL | Status: DC | PRN
Start: 2014-03-06 — End: 2014-03-10

## 2014-03-06 MED ORDER — PANTOPRAZOLE SODIUM 40 MG PO TBEC
80.0000 mg | DELAYED_RELEASE_TABLET | Freq: Every day | ORAL | Status: DC
  Administered 2014-03-06 – 2014-03-08 (×3): 80 mg via ORAL
  Filled 2014-03-06 (×3): qty 2

## 2014-03-06 MED ORDER — DOCUSATE SODIUM 100 MG PO CAPS
100.0000 mg | ORAL_CAPSULE | Freq: Two times a day (BID) | ORAL | Status: DC
Start: 2014-03-06 — End: 2014-03-10
  Administered 2014-03-06 – 2014-03-09 (×7): 100 mg via ORAL
  Filled 2014-03-06 (×7): qty 1

## 2014-03-06 MED ORDER — HYDROMORPHONE HCL PF 1 MG/ML IJ SOLN
INTRAMUSCULAR | Status: AC
  Filled 2014-03-06: qty 1

## 2014-03-06 MED ORDER — MORPHINE SULFATE 4 MG/ML IJ SOLN
6.0000 mg | Freq: Once | INTRAMUSCULAR | Status: AC
  Administered 2014-03-06: 6 mg via INTRAVENOUS

## 2014-03-06 MED ORDER — KETOROLAC TROMETHAMINE 30 MG/ML IJ SOLN
30.0000 mg | Freq: Three times a day (TID) | INTRAMUSCULAR | Status: DC
  Administered 2014-03-06 – 2014-03-07 (×4): 30 mg via INTRAVENOUS
  Filled 2014-03-06 (×4): qty 1

## 2014-03-06 MED ORDER — SODIUM CHLORIDE 0.9 % IV SOLN
INTRAVENOUS | Status: DC
  Administered 2014-03-06 – 2014-03-09 (×7): via INTRAVENOUS

## 2014-03-06 MED ORDER — ACETAMINOPHEN 325 MG PO TABS
650.0000 mg | ORAL_TABLET | Freq: Four times a day (QID) | ORAL | Status: DC | PRN
  Administered 2014-03-06: 650 mg via ORAL
  Filled 2014-03-06: qty 2

## 2014-03-06 MED ORDER — PROMETHAZINE HCL 25 MG/ML IJ SOLN
12.5000 mg | Freq: Four times a day (QID) | INTRAMUSCULAR | Status: DC | PRN
  Administered 2014-03-06 – 2014-03-07 (×2): 12.5 mg via INTRAVENOUS
  Filled 2014-03-06 (×2): qty 1

## 2014-03-06 MED ORDER — HYDROMORPHONE HCL PF 1 MG/ML IJ SOLN
1.0000 mg | Freq: Once | INTRAMUSCULAR | Status: AC
  Administered 2014-03-06: 1 mg via INTRAVENOUS

## 2014-03-06 MED ORDER — FLUTICASONE FUROATE 27.5 MCG/SPRAY NA SUSP: 2.0000 | NASAL | Status: DC

## 2014-03-06 MED ORDER — KETOROLAC TROMETHAMINE 30 MG/ML IJ SOLN
30.0000 mg | Freq: Once | INTRAMUSCULAR | Status: AC
  Administered 2014-03-06: 30 mg via INTRAVENOUS
  Filled 2014-03-06: qty 1

## 2014-03-06 MED ORDER — MORPHINE SULFATE 4 MG/ML IJ SOLN
6.0000 mg | Freq: Once | INTRAMUSCULAR | Status: AC
  Administered 2014-03-06: 6 mg via INTRAVENOUS
  Filled 2014-03-06: qty 2

## 2014-03-06 MED ORDER — FLUTICASONE PROPIONATE 50 MCG/ACT NA SUSP
1.0000 | Freq: Every day | NASAL | Status: DC
  Administered 2014-03-07 – 2014-03-09 (×2): 1 via NASAL
  Filled 2014-03-06: qty 16

## 2014-03-06 MED ORDER — ONDANSETRON HCL 4 MG/2ML IJ SOLN
4.0000 mg | Freq: Three times a day (TID) | INTRAMUSCULAR | Status: AC | PRN
  Administered 2014-03-06 (×2): 4 mg via INTRAVENOUS
  Filled 2014-03-06 (×2): qty 2

## 2014-03-06 MED ORDER — HYDROMORPHONE HCL PF 1 MG/ML IJ SOLN
1.0000 mg | INTRAMUSCULAR | Status: AC | PRN
  Administered 2014-03-06 (×3): 1 mg via INTRAVENOUS
  Filled 2014-03-06 (×3): qty 1
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
## 2014-03-06 NOTE — Consult Note (Signed)
NAME:  Arlyss QueenBAYSINGER, Gatsby             ACCOUNT NO.:  [id number]  MEDICAL RECORD NO.:  [id number]  LOCATION:  A312                          FACILITY:  [facility]  PHYSICIAN:  Ky BarbanMohammad I. Selita Staiger, M.D.DATE OF BIRTH:  1980/12/09  DATE OF CONSULTATION:  03/06/2014 DATE OF DISCHARGE:                                CONSULTATION   CHIEF COMPLAINT:  Recurrent right renal colic.  HISTORY:  This 33 year old gentleman is having pain in the right flank for the last 1 week as was his nausea and vomiting.  He came to the emergency room during the night.  A CT scan was done, it shows there is a 10-mm stone in the right upper ureter and a KUB was ordered.  The stone is easily visible on KUB, and it is located in the right ureteropelvic junction causing obstruction and he is still having some pain, but not as bad.  He was sound asleep because he was given Phenergan for his nausea and vomiting along with Dilaudid.  He denies any fever, chills, or any gross hematuria or any voiding difficulty.  He never had any kidney stones before.  PAST MEDICAL HISTORY:  No history of diabetes or hypertension.  PAST SURGICAL HISTORY:  Surgery on his right hand.  FAMILY HISTORY:  One of his brother has kidney stones.  PERSONAL HISTORY:  He smokes cigars.  No illicit drugs use.  REVIEW OF SYSTEMS:  Unremarkable.  PHYSICAL EXAMINATION:  VITAL SIGNS:  Blood pressure 128/83, temperature is 98.2. ABDOMEN:  Soft, flat.  Liver, spleen, kidneys not palpable.  1+ right CVA tenderness.  External genitalia is circumcised.  Meatus adequate. Testicles are normal. RECTAL:  Deferred. EXTREMITIES:  Normal.  LABORATORY WORK:  WBC count is 11.9, hematocrit is 44.3.  Sodium 140, potassium 4.1, chloride 100, BUN is 22, creatinine 1.27.  CT scan as I mentioned above.  KUB as I mentioned above.  IMPRESSION:  Right renal calculus.  PLAN:  I am waiting, if the pain gets under control, he can be discharged home.  Then, I can see him in  the office to follow up, but he needs to be scheduled for lithotripsy.  I told him that none stone can be broken with the machine and then he should be able to pass it.  It is a rather large stone at least between 10 and 15 mm in size.  If he continued to have pain, then I am going to go ahead and put a stent.  I told him about the stent which we will schedule for Monday under anesthesia.  If the pain gets worse in the meantime, continue IV fluids and parenteral analgesia.  We will follow.     Ky BarbanMohammad I. Shauntel Prest, M.D.     MIJ/MEDQ  D:  03/06/2014  T:  03/06/2014  Job:  [id number]

## 2014-03-06 NOTE — ED Notes (Signed)
Right flank and right lower abd pain x 1 week, vomiting, no diarrhea.

## 2014-03-06 NOTE — Consult Note (Signed)
Note [phone]#[id number]

## 2014-03-07 DIAGNOSIS — D72829 Elevated white blood cell count, unspecified: Secondary | ICD-10-CM

## 2014-03-07 DIAGNOSIS — N1330 Unspecified hydronephrosis: Secondary | ICD-10-CM

## 2014-03-07 DIAGNOSIS — N179 Acute kidney failure, unspecified: Secondary | ICD-10-CM

## 2014-03-07 LAB — CBC
HEMATOCRIT: 41.7 % (ref 39.0–52.0)
HEMOGLOBIN: 14.2 g/dL (ref 13.0–17.0)
MCH: 29.3 pg (ref 26.0–34.0)
MCHC: 34.1 g/dL (ref 30.0–36.0)
MCV: 86.2 fL (ref 78.0–100.0)
Platelets: 217 10*3/uL (ref 150–400)
RBC: 4.84 MIL/uL (ref 4.22–5.81)
RDW: 13.1 % (ref 11.5–15.5)
WBC: 10.1 10*3/uL (ref 4.0–10.5)

## 2014-03-07 LAB — BASIC METABOLIC PANEL
BUN: 22 mg/dL (ref 6–23)
CHLORIDE: 103 meq/L (ref 96–112)
CO2: 24 meq/L (ref 19–32)
CREATININE: 1.56 mg/dL — AB (ref 0.50–1.35)
Calcium: 8.6 mg/dL (ref 8.4–10.5)
GFR calc Af Amer: 67 mL/min — ABNORMAL LOW (ref >90)
GFR calc non Af Amer: 58 mL/min — ABNORMAL LOW (ref >90)
Glucose, Bld: 108 mg/dL — ABNORMAL HIGH (ref 70–99)
Potassium: 4 mEq/L (ref 3.7–5.3)
Sodium: 139 mEq/L (ref 137–147)

## 2014-03-07 MED ORDER — MORPHINE SULFATE 15 MG PO TABS
30.0000 mg | ORAL_TABLET | ORAL | Status: DC | PRN
  Administered 2014-03-07 – 2014-03-08 (×3): 30 mg via ORAL
  Filled 2014-03-07 (×3): qty 2

## 2014-03-07 MED ORDER — HYDROMORPHONE HCL PF 1 MG/ML IJ SOLN
1.0000 mg | INTRAMUSCULAR | Status: DC | PRN
  Administered 2014-03-07 – 2014-03-10 (×11): 1 mg via INTRAVENOUS
  Filled 2014-03-07 (×11): qty 1

## 2014-03-07 MED ORDER — ONDANSETRON HCL 4 MG/2ML IJ SOLN
4.0000 mg | Freq: Four times a day (QID) | INTRAMUSCULAR | Status: DC | PRN
  Administered 2014-03-07 – 2014-03-09 (×3): 4 mg via INTRAVENOUS
  Filled 2014-03-07 (×3): qty 2

## 2014-03-07 NOTE — Progress Notes (Signed)
Told patient and his wife he could ambulate on the unit but could not leave the floor.  Educated on risks of leaving floor while taking narcotic pain medication.  Patient verbalized understanding.

## 2014-03-07 NOTE — Progress Notes (Signed)
[facility] PROGRESS NOTE  Tom Humphrey ZOX:[id number] DOB: Jan 04, 1981 DOA: 03/06/2014 PCP: Henri MedalMOREIRA,FERNANDA I, MD  Assessment/Plan  Obstructing right ureteral stone  - Appreciate Urology assistance - Continue IV fluids  - oral morphine prn - Dilaudid when necessary  - Zofran and phenergan when necessary   AKI, may be due to toradol or obstructing stone -  D/c toradol -  Continue IVF -  Repeat BMP in AM  Leukocytosis likely reactive from pain/stress. No evidence of UTI on UA, resolved.  Seasonal allergies and acid reflux are stable. Continue home medications.   Cigar smoking, counseled cessation.  Diet:  regular Access:  PIV IVF:  yes Proph:  SCDs  Code Status: full Family Communication: patient alone Disposition Plan: pending improvement in pain and kidney function   Consultants:  urology  Procedures:  CT abd/pelvis  KUB  Antibiotics:  none   HPI/Subjective:  Pain still severe on back and right abdomen but slept okay last night.  Nausea improved.      Objective: Filed Vitals:   03/06/14 0837 03/06/14 1629 03/06/14 2300 03/07/14 0455  BP: 116/67 108/67 111/79 111/71  Pulse: 59 60 55 56  Temp: 97.5 F (36.4 C) 97.7 F (36.5 C) 97.8 F (36.6 C) 98.1 F (36.7 C)  TempSrc: Oral Oral Oral Oral  Resp: 18 18 20 20   Height: 6\' 3"  (1.905 m)     Weight: 97.6 kg (215 lb 2.7 oz)   100.9 kg (222 lb 7.1 oz)  SpO2: 95% 95% 100% 95%    Intake/Output Summary (Last 24 hours) at 03/07/14 1147 Last data filed at 03/07/14 1031  Gross per 24 hour  Intake 1686.67 ml  Output   1000 ml  Net 686.67 ml   Filed Weights   03/06/14 0400 03/06/14 0837 03/07/14 0455  Weight: 99.791 kg (220 lb) 97.6 kg (215 lb 2.7 oz) 100.9 kg (222 lb 7.1 oz)    Exam:   General:  CM, No acute distress  HEENT:  NCAT, MMM  Cardiovascular:  RRR, nl S1, S2 no mrg, 2+ pulses, warm extremities  Respiratory:  CTAB, no increased WOB  Abdomen:   Persistent right CVA  tenderness, NABS, soft, ND, TTP in the RUQ without rebound or guarding  MSK:   Normal tone and bulk, no LEE  Neuro:  Grossly intact  Data Reviewed: Basic Metabolic Panel:  Recent Labs Lab 03/06/14 0425 03/07/14 0642  NA 140 139  K 4.1 4.0  CL 100 103  CO2 25 24  GLUCOSE 113* 108*  BUN 22 22  CREATININE 1.27 1.56*  CALCIUM 9.8 8.6   Liver Function Tests: No results found for this basename: AST, ALT, ALKPHOS, BILITOT, PROT, ALBUMIN,  in the last 168 hours No results found for this basename: LIPASE, AMYLASE,  in the last 168 hours No results found for this basename: AMMONIA,  in the last 168 hours CBC:  Recent Labs Lab 03/06/14 0425 03/07/14 0642  WBC 11.9* 10.1  HGB 15.0 14.2  HCT 44.3 41.7  MCV 85.0 86.2  PLT 246 217   Cardiac Enzymes: No results found for this basename: CKTOTAL, CKMB, CKMBINDEX, TROPONINI,  in the last 168 hours BNP (last 3 results) No results found for this basename: PROBNP,  in the last 8760 hours CBG: No results found for this basename: GLUCAP,  in the last 168 hours  No results found for this or any previous visit (from the past 240 hour(s)).   Studies: Ct Abdomen Pelvis Wo Contrast  03/06/2014  CLINICAL DATA:  Right flank pain.  EXAM: CT ABDOMEN AND PELVIS WITHOUT CONTRAST  TECHNIQUE: Multidetector CT imaging of the abdomen and pelvis was performed following the standard protocol without intravenous contrast.  COMPARISON:  None.  FINDINGS: BODY WALL: Thickening deep to the right inguinal ring is likely from repair of an inguinal hernia given overlying cutaneous scar. The right testicle was intra scrotal on testicular ultrasound in 2012.  LOWER CHEST: Borderline circumferential distal esophageal thickening.  ABDOMEN/PELVIS:  Liver: Imaged portions unremarkable.  Biliary: No evidence of biliary obstruction or stone.  Pancreas: Unremarkable.  Spleen: Unremarkable.  Adrenals: Unremarkable.  Kidneys and ureters: 11 mm stone at the right ureteral  pelvic junction with obstructive uropathy including hydronephrosis and asymmetric perinephric edema. There is an additional punctate stone in the lower pole right kidney. No left-sided hydronephrosis or urolithiasis.  Bladder: Unremarkable.  Reproductive: Unremarkable.  Bowel: No obstruction. Normal appendix.  Retroperitoneum: No mass or adenopathy.  Peritoneum: No free fluid or gas.  Vascular: No acute abnormality.  OSSEOUS: No acute abnormalities.  IMPRESSION: Obstructing 11 mm stone at the right ureteropelvic junction.   Electronically Signed   By: Tiburcio Pea M.D.   On: 03/06/2014 05:28   Dg Abd 1 View  03/06/2014   CLINICAL DATA:  Right ureteral stone  EXAM: ABDOMEN - 1 VIEW  COMPARISON:  CT from earlier in the same day.  FINDINGS: The known proximal right ureteral stone is again identified and stable in appearance. No distal ureteral calculi are seen. The bony structures are within normal limits. Scattered large and small bowel gas is noted.  IMPRESSION: Stable proximal right ureteral stone.   Electronically Signed   By: Alcide Clever M.D.   On: 03/06/2014 07:30    Scheduled Meds: . docusate sodium  100 mg Oral BID  . fluticasone  1 spray Each Nare Daily  . pantoprazole  80 mg Oral Daily   Continuous Infusions: . sodium chloride 100 mL/hr at 03/07/14 0340    Active Problems:   Right ureteral stone   Hydronephrosis   Leukocytosis, unspecified   Allergic rhinitis   History of cigar smoking   GERD (gastroesophageal reflux disease)    Time spent: 30 min    Renae Fickle  [facility] Pager [phone]. If 7PM-7AM, please contact night-coverage at [email or web address], password [email or web address] 03/07/2014, 11:47 AM  LOS: 1 day

## 2014-03-08 LAB — BASIC METABOLIC PANEL
BUN: 18 mg/dL (ref 6–23)
CO2: 28 mEq/L (ref 19–32)
CREATININE: 1.66 mg/dL — AB (ref 0.50–1.35)
Calcium: 8.7 mg/dL (ref 8.4–10.5)
Chloride: 103 mEq/L (ref 96–112)
GFR calc Af Amer: 63 mL/min — ABNORMAL LOW (ref >90)
GFR calc non Af Amer: 54 mL/min — ABNORMAL LOW (ref >90)
GLUCOSE: 99 mg/dL (ref 70–99)
Potassium: 4.1 mEq/L (ref 3.7–5.3)
Sodium: 139 mEq/L (ref 137–147)

## 2014-03-08 LAB — CBC
HCT: 40.3 % (ref 39.0–52.0)
Hemoglobin: 13.9 g/dL (ref 13.0–17.0)
MCH: 29.8 pg (ref 26.0–34.0)
MCHC: 34.5 g/dL (ref 30.0–36.0)
MCV: 86.3 fL (ref 78.0–100.0)
Platelets: 216 10*3/uL (ref 150–400)
RBC: 4.67 MIL/uL (ref 4.22–5.81)
RDW: 12.9 % (ref 11.5–15.5)
WBC: 12.1 10*3/uL — ABNORMAL HIGH (ref 4.0–10.5)

## 2014-03-08 NOTE — Progress Notes (Signed)
[facility] PROGRESS NOTE  Tom Humphrey WGN:[id number] DOB: February 07, 1981 DOA: 03/06/2014 PCP: Henri MedalMOREIRA,FERNANDA I, MD  Assessment/Plan  Obstructing right ureteral stone, with ongoing pain  - Appreciate Urology assistance - Continue IV fluids  - oral morphine prn - Dilaudid when necessary  - Zofran and phenergan when necessary  -  Plan for stent tomorrow by Urology  AKI, may be due to toradol or obstructing stone -  Minimize nephrotoxins  -  Continue IVF -  Repeat BMP in AM  Leukocytosis likely reactive from pain/stress. No evidence of UTI on UA, resolved.  Seasonal allergies and acid reflux are stable. Continue home medications.   Cigar smoking, counseled cessation.  Diet:  regular Access:  PIV IVF:  yes Proph:  SCDs  Code Status: full Family Communication: patient alone Disposition Plan: pending improvement in pain and kidney function.  Likely will need stent placed tomorrow.     Consultants:  urology  Procedures:  CT abd/pelvis  KUB  Antibiotics:  none   HPI/Subjective:  Pain still severe on back and right abdomen.  Pain worst in the back today.   Objective: Filed Vitals:   03/07/14 0455 03/07/14 1453 03/07/14 2042 03/08/14 0553  BP: 111/71 113/77 128/78 115/72  Pulse: 56 58 58 69  Temp: 98.1 F (36.7 C) 98.2 F (36.8 C) 98.1 F (36.7 C) 98.4 F (36.9 C)  TempSrc: Oral  Oral Oral  Resp: 20 20 20 20   Height:      Weight: 100.9 kg (222 lb 7.1 oz)     SpO2: 95% 98% 93% 94%    Intake/Output Summary (Last 24 hours) at 03/08/14 1559 Last data filed at 03/08/14 0912  Gross per 24 hour  Intake 3698.33 ml  Output   2500 ml  Net 1198.33 ml   Filed Weights   03/06/14 0400 03/06/14 0837 03/07/14 0455  Weight: 99.791 kg (220 lb) 97.6 kg (215 lb 2.7 oz) 100.9 kg (222 lb 7.1 oz)    Exam:   General:  CM, No acute distress, unchanged from prior  HEENT:  NCAT, MMM  Cardiovascular:  RRR, nl S1, S2 no mrg, 2+ pulses, warm  extremities  Respiratory:  CTAB, no increased WOB  Abdomen:   Persistent right CVA tenderness, NABS, soft, ND, TTP in the RUQ without rebound or guarding  MSK:   Normal tone and bulk, no LEE  Neuro:  Grossly intact  Data Reviewed: Basic Metabolic Panel:  Recent Labs Lab 03/06/14 0425 03/07/14 0642 03/08/14 0610  NA 140 139 139  K 4.1 4.0 4.1  CL 100 103 103  CO2 25 24 28   GLUCOSE 113* 108* 99  BUN 22 22 18   CREATININE 1.27 1.56* 1.66*  CALCIUM 9.8 8.6 8.7   Liver Function Tests: No results found for this basename: AST, ALT, ALKPHOS, BILITOT, PROT, ALBUMIN,  in the last 168 hours No results found for this basename: LIPASE, AMYLASE,  in the last 168 hours No results found for this basename: AMMONIA,  in the last 168 hours CBC:  Recent Labs Lab 03/06/14 0425 03/07/14 0642 03/08/14 0610  WBC 11.9* 10.1 12.1*  HGB 15.0 14.2 13.9  HCT 44.3 41.7 40.3  MCV 85.0 86.2 86.3  PLT 246 217 216   Cardiac Enzymes: No results found for this basename: CKTOTAL, CKMB, CKMBINDEX, TROPONINI,  in the last 168 hours BNP (last 3 results) No results found for this basename: PROBNP,  in the last 8760 hours CBG: No results found for this basename: GLUCAP,  in  the last 168 hours  No results found for this or any previous visit (from the past 240 hour(s)).   Studies: No results found.  Scheduled Meds: . docusate sodium  100 mg Oral BID  . fluticasone  1 spray Each Nare Daily  . pantoprazole  80 mg Oral Daily   Continuous Infusions: . sodium chloride 100 mL/hr at 03/08/14 1610    Active Problems:   Right ureteral stone   Hydronephrosis   Leukocytosis, unspecified   Allergic rhinitis   History of cigar smoking   GERD (gastroesophageal reflux disease)   AKI (acute kidney injury)    Time spent: 30 min    Renae Fickle  [facility] Pager [phone]. If 7PM-7AM, please contact night-coverage at [email or web address], password [facility] 03/08/2014, 3:59 PM  LOS: 2 days

## 2014-03-08 NOTE — Progress Notes (Signed)
Temp 99.3 still has pain r flak .will insert double j under anesthesia tomorrow i have dis cussed the procedure with him he wll undergo esl fir r renal stone/

## 2014-03-09 ENCOUNTER — Inpatient Hospital Stay (HOSPITAL_COMMUNITY): Payer: Medicaid Other | Admitting: Anesthesiology

## 2014-03-09 ENCOUNTER — Encounter (HOSPITAL_COMMUNITY): Payer: Self-pay | Admitting: *Deleted

## 2014-03-09 ENCOUNTER — Inpatient Hospital Stay (HOSPITAL_COMMUNITY): Payer: Medicaid Other

## 2014-03-09 ENCOUNTER — Encounter (HOSPITAL_COMMUNITY): Payer: Medicaid Other | Admitting: Anesthesiology

## 2014-03-09 ENCOUNTER — Encounter (HOSPITAL_COMMUNITY)
Admission: EM | Disposition: A | Discharge status: Home / Self Care | Payer: Self-pay | Source: Home / Self Care | Attending: Internal Medicine

## 2014-03-09 HISTORY — PX: CYSTOSCOPY W/ URETERAL STENT PLACEMENT: SHX1429

## 2014-03-09 LAB — BASIC METABOLIC PANEL
BUN: 15 mg/dL (ref 6–23)
CO2: 28 mEq/L (ref 19–32)
CREATININE: 1.59 mg/dL — AB (ref 0.50–1.35)
Calcium: 9 mg/dL (ref 8.4–10.5)
Chloride: 103 mEq/L (ref 96–112)
GFR, EST AFRICAN AMERICAN: 66 mL/min — AB (ref >90)
GFR, EST NON AFRICAN AMERICAN: 57 mL/min — AB (ref >90)
Glucose, Bld: 104 mg/dL — ABNORMAL HIGH (ref 70–99)
Potassium: 3.9 mEq/L (ref 3.7–5.3)
Sodium: 140 mEq/L (ref 137–147)

## 2014-03-09 LAB — CBC
HEMATOCRIT: 39.6 % (ref 39.0–52.0)
Hemoglobin: 13.7 g/dL (ref 13.0–17.0)
MCH: 29.8 pg (ref 26.0–34.0)
MCHC: 34.6 g/dL (ref 30.0–36.0)
MCV: 86.1 fL (ref 78.0–100.0)
Platelets: 209 10*3/uL (ref 150–400)
RBC: 4.6 MIL/uL (ref 4.22–5.81)
RDW: 12.8 % (ref 11.5–15.5)
WBC: 12.3 10*3/uL — ABNORMAL HIGH (ref 4.0–10.5)

## 2014-03-09 LAB — MRSA PCR SCREENING: MRSA BY PCR: NEGATIVE

## 2014-03-09 SURGERY — CYSTOSCOPY, WITH RETROGRADE PYELOGRAM AND URETERAL STENT INSERTION
Anesthesia: General | Site: Urethra | Laterality: Right

## 2014-03-09 MED ORDER — FENTANYL CITRATE 0.05 MG/ML IJ SOLN
INTRAMUSCULAR | Status: AC
  Filled 2014-03-09: qty 5

## 2014-03-09 MED ORDER — STERILE WATER FOR IRRIGATION IR SOLN
Status: DC | PRN
  Administered 2014-03-09: 1000 mL

## 2014-03-09 MED ORDER — SUCCINYLCHOLINE CHLORIDE 20 MG/ML IJ SOLN
INTRAMUSCULAR | Status: AC
  Filled 2014-03-09: qty 1

## 2014-03-09 MED ORDER — MIDAZOLAM HCL 2 MG/2ML IJ SOLN
1.0000 mg | INTRAMUSCULAR | Status: DC | PRN
  Administered 2014-03-09: 2 mg via INTRAVENOUS

## 2014-03-09 MED ORDER — GLYCOPYRROLATE 0.2 MG/ML IJ SOLN
INTRAMUSCULAR | Status: DC | PRN
  Administered 2014-03-09: 0.4 mg via INTRAVENOUS

## 2014-03-09 MED ORDER — PROMETHAZINE HCL 12.5 MG PO TABS: 12.5000 mg | ORAL_TABLET | Freq: Four times a day (QID) | ORAL | Status: DC | PRN

## 2014-03-09 MED ORDER — ALBUTEROL SULFATE (2.5 MG/3ML) 0.083% IN NEBU
INHALATION_SOLUTION | RESPIRATORY_TRACT | Status: AC
  Filled 2014-03-09: qty 3

## 2014-03-09 MED ORDER — FENTANYL CITRATE 0.05 MG/ML IJ SOLN
INTRAMUSCULAR | Status: DC | PRN
  Administered 2014-03-09 (×5): 50 ug via INTRAVENOUS

## 2014-03-09 MED ORDER — ALBUTEROL SULFATE (2.5 MG/3ML) 0.083% IN NEBU
2.5000 mg | INHALATION_SOLUTION | Freq: Once | RESPIRATORY_TRACT | Status: AC
  Administered 2014-03-09: 2.5 mg via RESPIRATORY_TRACT

## 2014-03-09 MED ORDER — MORPHINE SULFATE 30 MG PO TABS: 30.0000 mg | ORAL_TABLET | ORAL | Status: DC | PRN

## 2014-03-09 MED ORDER — PROPOFOL 10 MG/ML IV BOLUS
INTRAVENOUS | Status: AC
  Filled 2014-03-09: qty 20

## 2014-03-09 MED ORDER — LACTATED RINGERS IV SOLN
INTRAVENOUS | Status: DC
  Administered 2014-03-09 (×2): via INTRAVENOUS

## 2014-03-09 MED ORDER — SODIUM CHLORIDE 0.9 % IR SOLN
Status: DC | PRN
  Administered 2014-03-09: 3000 mL

## 2014-03-09 MED ORDER — ROCURONIUM BROMIDE 100 MG/10ML IV SOLN
INTRAVENOUS | Status: DC | PRN
  Administered 2014-03-09: 7 mg via INTRAVENOUS
  Administered 2014-03-09: 23 mg via INTRAVENOUS

## 2014-03-09 MED ORDER — LIDOCAINE HCL 1 % IJ SOLN
INTRAMUSCULAR | Status: DC | PRN
  Administered 2014-03-09: 30 mg via INTRADERMAL

## 2014-03-09 MED ORDER — LIDOCAINE HCL (PF) 1 % IJ SOLN
INTRAMUSCULAR | Status: AC
  Filled 2014-03-09: qty 5

## 2014-03-09 MED ORDER — NEOSTIGMINE METHYLSULFATE 1 MG/ML IJ SOLN
INTRAMUSCULAR | Status: DC | PRN
  Administered 2014-03-09: 2 mg via INTRAVENOUS

## 2014-03-09 MED ORDER — PROPOFOL 10 MG/ML IV BOLUS
INTRAVENOUS | Status: DC | PRN
  Administered 2014-03-09: 200 mg via INTRAVENOUS

## 2014-03-09 MED ORDER — MIDAZOLAM HCL 2 MG/2ML IJ SOLN
INTRAMUSCULAR | Status: AC
  Filled 2014-03-09: qty 2

## 2014-03-09 MED ORDER — ROCURONIUM BROMIDE 50 MG/5ML IV SOLN
INTRAVENOUS | Status: AC
  Filled 2014-03-09: qty 1

## 2014-03-09 MED ORDER — GI COCKTAIL ~~LOC~~
ORAL | Status: AC
  Filled 2014-03-09: qty 30

## 2014-03-09 MED ORDER — SODIUM CHLORIDE 0.9 % IN NEBU
INHALATION_SOLUTION | RESPIRATORY_TRACT | Status: AC
  Filled 2014-03-09: qty 3

## 2014-03-09 MED ORDER — GI COCKTAIL ~~LOC~~
30.0000 mL | Freq: Once | ORAL | Status: AC
  Administered 2014-03-09: 30 mL via ORAL

## 2014-03-09 MED ORDER — IOHEXOL 350 MG/ML SOLN
INTRAVENOUS | Status: DC | PRN
  Administered 2014-03-09: 50 mL via URETHRAL

## 2014-03-09 MED ORDER — SUCCINYLCHOLINE CHLORIDE 20 MG/ML IJ SOLN
INTRAMUSCULAR | Status: DC | PRN
  Administered 2014-03-09: 120 mg via INTRAVENOUS

## 2014-03-09 MED ORDER — GLYCOPYRROLATE 0.2 MG/ML IJ SOLN
INTRAMUSCULAR | Status: AC
  Filled 2014-03-09: qty 2

## 2014-03-09 SURGICAL SUPPLY — 24 items
BAG DRAIN URO TABLE W/ADPT NS (DRAPE) ×4 IMPLANT
BAG DRN 8 ADPR NS SKTRN CSTL (DRAPE) ×2
CATH 5 FR WEDGE TIP (UROLOGICAL SUPPLIES) ×4 IMPLANT
CATH OPEN TIP 5FR (CATHETERS) ×4 IMPLANT
CLOTH BEACON ORANGE TIMEOUT ST (SAFETY) ×4 IMPLANT
DECANTER SPIKE VIAL GLASS SM (MISCELLANEOUS) ×4 IMPLANT
DILATOR UROMAX ULTRA (MISCELLANEOUS) IMPLANT
GLIDEWIRE 3CM TIP (WIRE) ×2 IMPLANT
GLOVE BIO SURGEON STRL SZ7 (GLOVE) ×4 IMPLANT
GLOVE BIOGEL PI IND STRL 7.0 (GLOVE) IMPLANT
GLOVE BIOGEL PI IND STRL 8 (GLOVE) IMPLANT
GLOVE BIOGEL PI INDICATOR 7.0 (GLOVE) ×2
GLOVE BIOGEL PI INDICATOR 8 (GLOVE) ×2
GOWN STRL REUS W/TWL LRG LVL3 (GOWN DISPOSABLE) ×4 IMPLANT
IV NS IRRIG 3000ML ARTHROMATIC (IV SOLUTION) ×8 IMPLANT
KIT ROOM TURNOVER AP CYSTO (KITS) ×4 IMPLANT
MANIFOLD NEPTUNE II (INSTRUMENTS) ×4 IMPLANT
PACK CYSTO (CUSTOM PROCEDURE TRAY) ×4 IMPLANT
PAD ARMBOARD 7.5X6 YLW CONV (MISCELLANEOUS) ×4 IMPLANT
SET IRRIGATING DISP (SET/KITS/TRAYS/PACK) ×4 IMPLANT
STENT PERCUFLEX 4.8FRX24 (STENTS) ×2 IMPLANT
STONE RETRIEVAL GEMINI 2.4 FR (MISCELLANEOUS) IMPLANT
TOWEL OR 17X26 4PK STRL BLUE (TOWEL DISPOSABLE) ×4 IMPLANT
WIRE GUIDE BENTSON .035 15CM (WIRE) ×6 IMPLANT

## 2014-03-09 NOTE — Transfer of Care (Signed)
Immediate Anesthesia Transfer of Care Note  Patient: Tom Humphrey  Procedure(s) Performed: Procedure(s) (LRB): CYSTOSCOPY WITH RETROGRADE PYELOGRAM/URETERAL STENT PLACEMENT (Right) URETERAL DILITATION (N/A)  Patient Location: PACU  Anesthesia Type: General  Level of Consciousness: awake  Airway & Oxygen Therapy: Patient Spontanous Breathing and non-rebreather face mask  Post-op Assessment: Report given to PACU RN, Post -op Vital signs reviewed and stable and Patient moving all extremities  Post vital signs: Reviewed and stable  Complications: No apparent anesthesia complications

## 2014-03-09 NOTE — Brief Op Note (Signed)
03/06/2014 - 03/09/2014  2:26 PM  PATIENT:  Tom Humphrey  33 y.o. male  PRE-OPERATIVE DIAGNOSIS:  r upj stone  POST-OPERATIVE DIAGNOSIS:  r upj stone, urethral stricture  PROCEDURE:  Procedure(s): CYSTOSCOPY WITH RETROGRADE PYELOGRAM/URETERAL STENT PLACEMENT (Right) URETERAL DILITATION (N/A)  SURGEON:  Surgeon(s) and Role:    * Ky BarbanMohammad I Matha Masse, MD - Primary  PHYSICIAN ASSISTANT:   ASSISTANTS: none   ANESTHESIA:     EBL:  Total I/O In: 1000 [I.V.:1000] Out: 650 [Urine:650]  BLOOD ADMINISTERED:none  DRAINS: double j stent r ureter f5 24cm nostring   LOCAL MEDICATIONS USED:  NONE  SPECIMEN:  No Specimen  DISPOSITION OF SPECIMEN:  N/A  COUNTS:  YES  TOURNIQUET:  * No tourniquets in log *  DICTATION: .Other Dictation: Dictation Number dictation [id number]  PLAN OF CARE: Admit for overnight observation  PATIENT DISPOSITION:  PACU - hemodynamically stable.   Delay start of Pharmacological VTE agent (>24hrs) due to surgical blood loss or risk of bleeding:

## 2014-03-09 NOTE — Anesthesia Postprocedure Evaluation (Signed)
  Anesthesia Post-op Note  Patient: Tom Humphrey  Procedure(s) Performed: Procedure(s): CYSTOSCOPY WITH RETROGRADE PYELOGRAM/URETERAL STENT PLACEMENT (Right) URETERAL DILITATION (N/A)  Patient Location: PACU  Anesthesia Type:General  Level of Consciousness: awake and patient cooperative  Airway and Oxygen Therapy: Patient Spontanous Breathing and non-rebreather face mask  Post-op Pain: mild  Post-op Assessment: Post-op Vital signs reviewed, Patient's Cardiovascular Status Stable, Respiratory Function Stable and No signs of Nausea or vomiting  Post-op Vital Signs: Reviewed and stable  Last Vitals:  Filed Vitals:   03/09/14 1436  BP: 133/73  Pulse: 82  Temp: 36.5 C  Resp: 18    Complications: No apparent anesthesia complications

## 2014-03-09 NOTE — Anesthesia Preprocedure Evaluation (Signed)
Anesthesia Evaluation  Patient identified by MRN, date of birth, ID band Patient awake    Reviewed: Allergy & Precautions, H&P , NPO status , Patient's Chart, lab work & pertinent test results  History of Anesthesia Complications Negative for: history of anesthetic complications  Airway Mallampati: II      Dental  (+) Teeth Intact   Pulmonary asthma (allergies) , Current Smoker,  breath sounds clear to auscultation        Cardiovascular Rhythm:Regular Rate:Normal     Neuro/Psych    GI/Hepatic GERD-  Medicated and Controlled,  Endo/Other    Renal/GU      Musculoskeletal   Abdominal   Peds  Hematology   Anesthesia Other Findings   Reproductive/Obstetrics                           Anesthesia Physical Anesthesia Plan  ASA: II  Anesthesia Plan: General   Post-op Pain Management:    Induction: Intravenous, Rapid sequence and Cricoid pressure planned  Airway Management Planned: Oral ETT  Additional Equipment:   Intra-op Plan:   Post-operative Plan: Extubation in OR  Informed Consent: I have reviewed the patients History and Physical, chart, labs and discussed the procedure including the risks, benefits and alternatives for the proposed anesthesia with the patient or authorized representative who has indicated his/her understanding and acceptance.     Plan Discussed with:   Anesthesia Plan Comments:         Anesthesia Quick Evaluation

## 2014-03-09 NOTE — Progress Notes (Signed)
[facility] PROGRESS NOTE  Tom PearJames D Soper QIO:[id number]:[id number] DOB: August 14, 1981 DOA: 03/06/2014 PCP: Henri MedalMOREIRA,FERNANDA I, MD  Assessment/Plan  Obstructing right ureteral stone, with ongoing pain  - Appreciate Urology assistance - Continue IV fluids  - oral morphine prn - Dilaudid when necessary  - Zofran and phenergan when necessary  -  Stent today by Urology  AKI, may be due to toradol or obstructing stone.  Stent today -  Minimize nephrotoxins  -  Continue IVF -  Repeat BMP in AM  Leukocytosis likely reactive from pain/stress. No evidence of UTI on UA. -  Repeat in AM  Seasonal allergies and acid reflux are stable. Continue home medications.   Cigar smoking, counseled cessation.  Diet:  regular Access:  PIV IVF:  yes Proph:  SCDs  Code Status: full Family Communication: patient alone Disposition Plan:  Likely home tomorrow   Consultants:  urology  Procedures:  CT abd/pelvis  KUB  Antibiotics:  none   HPI/Subjective:  Pain still severe on back and right abdomen.    Objective: Filed Vitals:   03/09/14 1240 03/09/14 1245 03/09/14 1436 03/09/14 1445  BP: 112/67 124/80 133/73 126/79  Pulse:   82 68  Temp:   97.7 F (36.5 C)   TempSrc:   Oral   Resp: 13 17 18 16   Height:      Weight:      SpO2: 99% 97% 97% 93%    Intake/Output Summary (Last 24 hours) at 03/09/14 1512 Last data filed at 03/09/14 1435  Gross per 24 hour  Intake 4271.67 ml  Output   1650 ml  Net 2621.67 ml   Filed Weights   03/06/14 0837 03/07/14 0455 03/09/14 0543  Weight: 97.6 kg (215 lb 2.7 oz) 100.9 kg (222 lb 7.1 oz) 105.6 kg (232 lb 12.9 oz)    Exam:   General:  CM, No acute distress  HEENT:  NCAT, MMM  Cardiovascular:  RRR, nl S1, S2 no mrg, 2+ pulses, warm extremities  Respiratory:  CTAB, no increased WOB  Abdomen:   Persistent right CVA tenderness, NABS, soft, ND, TTP in the RUQ without rebound or guarding, stable  MSK:   Normal tone and bulk, no  LEE  Neuro:  Grossly intact  Data Reviewed: Basic Metabolic Panel:  Recent Labs Lab 03/06/14 0425 03/07/14 0642 03/08/14 0610 03/09/14 0515  NA 140 139 139 140  K 4.1 4.0 4.1 3.9  CL 100 103 103 103  CO2 25 24 28 28   GLUCOSE 113* 108* 99 104*  BUN 22 22 18 15   CREATININE 1.27 1.56* 1.66* 1.59*  CALCIUM 9.8 8.6 8.7 9.0   Liver Function Tests: No results found for this basename: AST, ALT, ALKPHOS, BILITOT, PROT, ALBUMIN,  in the last 168 hours No results found for this basename: LIPASE, AMYLASE,  in the last 168 hours No results found for this basename: AMMONIA,  in the last 168 hours CBC:  Recent Labs Lab 03/06/14 0425 03/07/14 0642 03/08/14 0610 03/09/14 0515  WBC 11.9* 10.1 12.1* 12.3*  HGB 15.0 14.2 13.9 13.7  HCT 44.3 41.7 40.3 39.6  MCV 85.0 86.2 86.3 86.1  PLT 246 217 216 209   Cardiac Enzymes: No results found for this basename: CKTOTAL, CKMB, CKMBINDEX, TROPONINI,  in the last 168 hours BNP (last 3 results) No results found for this basename: PROBNP,  in the last 8760 hours CBG: No results found for this basename: GLUCAP,  in the last 168 hours  Recent Results (from the past  240 hour(s))  MRSA PCR SCREENING     Status: None   Collection Time    03/08/14 11:33 PM      Result Value Ref Range Status   MRSA by PCR NEGATIVE  NEGATIVE Final   Comment:            The GeneXpert MRSA Assay (FDA     approved for NASAL specimens     only), is one component of a     comprehensive MRSA colonization     surveillance program. It is not     intended to diagnose MRSA     infection nor to guide or     monitor treatment for     MRSA infections.     Studies: No results found.  Scheduled Meds: . [facility][MAR HOLD] docusate sodium  100 mg Oral BID  . [MAR HOLD] fluticasone  1 spray Each Nare Daily  . [facility][MAR HOLD] pantoprazole  80 mg Oral Daily   Continuous Infusions: . sodium chloride 100 mL/hr at 03/09/14 0414  . lactated ringers 75 mL/hr at 03/09/14 1226     Active Problems:   Right ureteral stone   Hydronephrosis   Leukocytosis, unspecified   Allergic rhinitis   History of cigar smoking   GERD (gastroesophageal reflux disease)   AKI (acute kidney injury)    Time spent: 30 min    Renae FickleMackenzie Bryson Palen  [facility] Pager [phone]. If 7PM-7AM, please contact night-coverage at [email or web address], password [facility] 03/09/2014, 3:12 PM  LOS: 3 days

## 2014-03-09 NOTE — Anesthesia Procedure Notes (Signed)
Procedure Name: Intubation Date/Time: 03/09/2014 1:07 PM Performed by: Glynn OctaveANIEL, Peytin Dechert E Pre-anesthesia Checklist: Patient identified, Patient being monitored, Timeout performed, Emergency Drugs available and Suction available Patient Re-evaluated:Patient Re-evaluated prior to inductionOxygen Delivery Method: Circle System Utilized Preoxygenation: Pre-oxygenation with 100% oxygen Intubation Type: IV induction, Rapid sequence and Cricoid Pressure applied Ventilation: Mask ventilation without difficulty Laryngoscope Size: Mac and 3 Grade View: Grade I Tube type: Oral Tube size: 8.0 mm Number of attempts: 1 Airway Equipment and Method: stylet Placement Confirmation: ETT inserted through vocal cords under direct vision,  positive ETCO2 and breath sounds checked- equal and bilateral Secured at: 21 cm Tube secured with: Tape Dental Injury: Teeth and Oropharynx as per pre-operative assessment

## 2014-03-09 NOTE — Brief Op Note (Signed)
03/06/2014 - 03/09/2014  2:28 PM  PATIENT:  Tom Humphrey  33 y.o. male  PRE-OPERATIVE DIAGNOSIS:  r upj stone  POST-OPERATIVE DIAGNOSIS:  r upj stone, urethral stricture  PROCEDURE:  Procedure(s): CYSTOSCOPY WITH RETROGRADE PYELOGRAM/URETERAL STENT PLACEMENT (Right) URETERAL DILITATION (N/A)  SURGEON:  Surgeon(s) and Role:    * Ky BarbanMohammad I Jansen Sciuto, MD - Primary  PHYSICIAN ASSISTANT:   ASSISTANTS: none   ANESTHESIA:   general  EBL:  Total I/O In: 1000 [I.V.:1000] Out: 650 [Urine:650]  BLOOD ADMINISTERED:none  DRAINS: double j stentf5 24cm   LOCAL MEDICATIONS USED:  NONE  SPECIMEN:  No Specimen  DISPOSITION OF SPECIMEN:  N/A  COUNTS:  YES  TOURNIQUET:  * No tourniquets in log *  DICTATION: .Other Dictation: Dictation Number dictation [id number]  PLAN OF CARE: Admit for overnight observation  PATIENT DISPOSITION:  PACU - hemodynamically stable.   Delay start of Pharmacological VTE agent (>24hrs) due to surgical blood loss or risk of bleeding:

## 2014-03-10 LAB — CBC
HCT: 39 % (ref 39.0–52.0)
HEMOGLOBIN: 13.5 g/dL (ref 13.0–17.0)
MCH: 29.6 pg (ref 26.0–34.0)
MCHC: 34.6 g/dL (ref 30.0–36.0)
MCV: 85.5 fL (ref 78.0–100.0)
PLATELETS: 216 10*3/uL (ref 150–400)
RBC: 4.56 MIL/uL (ref 4.22–5.81)
RDW: 12.7 % (ref 11.5–15.5)
WBC: 9.9 10*3/uL (ref 4.0–10.5)

## 2014-03-10 LAB — BASIC METABOLIC PANEL
BUN: 14 mg/dL (ref 6–23)
CALCIUM: 9.1 mg/dL (ref 8.4–10.5)
CO2: 27 meq/L (ref 19–32)
Chloride: 103 mEq/L (ref 96–112)
Creatinine, Ser: 1.28 mg/dL (ref 0.50–1.35)
GFR calc Af Amer: 86 mL/min — ABNORMAL LOW (ref >90)
GFR calc non Af Amer: 74 mL/min — ABNORMAL LOW (ref >90)
Glucose, Bld: 96 mg/dL (ref 70–99)
POTASSIUM: 3.9 meq/L (ref 3.7–5.3)
SODIUM: 141 meq/L (ref 137–147)

## 2014-03-10 NOTE — Progress Notes (Signed)
Mr.Hoiland, was discharged this am with instructions given on medications,and follow up visits,patient verbalized understanding,Dr Jerre SimonJavaid, informed the patient to come by the office this am after discharge,directions were given with patient understanding. Prescriptions sent with patient.No c/o pain or discomfort noted. Vital signs stable. Accompanied by staff to an awaiting vehicle.

## 2014-03-10 NOTE — Op Note (Signed)
NAME:  [name], Tom             ACCOUNT NO.:  [id number]  MEDICAL RECORD NO.:  [id number]  LOCATION:                                 FACILITY:  PHYSICIAN:  Ky BarbanMohammad I. Wannetta Langland, M.D.DATE OF BIRTH:  March 05, 1981  DATE OF PROCEDURE:  03/09/2014 DATE OF DISCHARGE:                              OPERATIVE REPORT   PREOPERATIVE DIAGNOSIS:  Right upper ureteral calculus.  POSTOPERATIVE DIAGNOSIS:  Urethral stricture plus right upper ureteral calculus.  PROCEDURES:  Cysto-dilation, insertion of double-J stent size 5-French 24 cm, no string attached.  ANESTHESIA:  General.  DESCRIPTION OF PROCEDURE:  The patient under general endotracheal anesthesia in lithotomy position after usual prep and drape, I tried to pass #22 cystoscope.  It will not go because the distal urethra was very tight.  So, I had to dilate him with Sissy Hoffvan Buren sound.  The meatus was very tight.  Once it was dilated, I was able to pass #25 cystoscope into the bladder and the bladder was inspected looks normal right ureteral orifice was catheterized with an open-ended catheter and guidewire.  The open-end catheter and guidewire was advanced to the level of the right upper ureteral calculus, and I tried to advanced Bentson guidewire above the stone, but stone was badly impacted, it will not go.  So, I tried a Glidewire and with some difficulty went up above the stone, but now the open-end catheter will not glide over the Glidewire.  I tried different techniques, but the catheter will not go beyond the stone, it looks like the stone is really badly impacted.  So, I decided to remove the Glidewire and put a Bentson guidewire again, which went up into the renal pelvis and through the open-ended catheter.  Although the catheter was below the stone, but at this time Bentson guidewire went above the stone without difficulty, I was able to advance the open-ended catheter over the Bentson guidewire up into the renal pelvis.  Once  it was in the renal pelvis, I removed the Bentson guidewire and hydronephrotic drip was obtained.  The urine was collected for culture and hydronephrotic drip was collected for urine culture.  Then, I decided to inject the dye.  The dye was injected under fluoroscopic control, it outlines the collecting system, so I was sure that my open-ended catheter is in the collecting system, so Bentson guidewire was reinserted and open-end catheter was removed.  Once I removed the open-ended catheter, I was able to insert a 5-French 24-cm double-J stent over the Bentson guidewire without much difficulty.  A loop was obtained in the renal pelvis after him pulling the guidewire down and a 2nd loop was made in the bladder after removing the guidewire.  The guidewire was completely removed.  Double-J stent was in proper position above the stone.  At this point, I removed all the instruments and he was further dilated to have to 24-French with [employer] Buren sounds.  All the instruments were removed.  The patient left the operating room in satisfactory condition.     Ky BarbanMohammad I. Yadira Hada, M.D.     MIJ/MEDQ  D:  03/09/2014  T:  03/10/2014  Job:  [id number]

## 2014-03-11 ENCOUNTER — Encounter (HOSPITAL_COMMUNITY): Payer: Self-pay | Admitting: Urology

## 2014-03-11 LAB — URINE CULTURE
CULTURE: NO GROWTH
Colony Count: NO GROWTH

## 2014-03-30 NOTE — Discharge Summary (Signed)
Patient ID: Tom Humphrey MRN: [id number] DOB/AGE: 1980-12-09 33 y.o.  Admit date: 03/06/2014 Discharge date: 03/30/2014  Primary Care Physician:  Henri Medal, MD  Discharge Diagnoses:   Present on Admission:  . Right ureteral stone  Consults:  NONE  Patient ID: Tom Humphrey MRN: [id number] DOB/AGE: 1981-07-13 33 y.o.  Admit date: 03/06/2014 Discharge date: 03/30/2014  Primary Care Physician:  Henri Medal, MD  Discharge Diagnoses:   Present on Admission:  . Right ureteral stone  Consults:    Discharge Medications:   Medication List         fluticasone 27.5 MCG/SPRAY nasal spray  Commonly known as:  VERAMYST  Place 2 sprays into the nose every morning.     morphine 30 MG tablet  Commonly known as:  MSIR  Take 1 tablet (30 mg total) by mouth every 4 (four) hours as needed for moderate pain or severe pain.     omeprazole 40 MG capsule  Commonly known as:  PRILOSEC  Take 40 mg by mouth daily. Take on an empty stomach 30 minutes before breakfast.     promethazine 12.5 MG tablet  Commonly known as:  PHENERGAN  Take 1 tablet (12.5 mg total) by mouth every 6 (six) hours as needed for nausea or vomiting.         Significant Diagnostic Studies:  Ct Abdomen Pelvis Wo Contrast  03/06/2014   CLINICAL DATA:  Right flank pain.  EXAM: CT ABDOMEN AND PELVIS WITHOUT CONTRAST  TECHNIQUE: Multidetector CT imaging of the abdomen and pelvis was performed following the standard protocol without intravenous contrast.  COMPARISON:  None.  FINDINGS: BODY WALL: Thickening deep to the right inguinal ring is likely from repair of an inguinal hernia given overlying cutaneous scar. The right testicle was intra scrotal on testicular ultrasound in 2012.  LOWER CHEST: Borderline circumferential distal esophageal thickening.  ABDOMEN/PELVIS:  Liver: Imaged portions unremarkable.  Biliary: No evidence of biliary obstruction or stone.  Pancreas: Unremarkable.  Spleen: Unremarkable.   Adrenals: Unremarkable.  Kidneys and ureters: 11 mm stone at the right ureteral pelvic junction with obstructive uropathy including hydronephrosis and asymmetric perinephric edema. There is an additional punctate stone in the lower pole right kidney. No left-sided hydronephrosis or urolithiasis.  Bladder: Unremarkable.  Reproductive: Unremarkable.  Bowel: No obstruction. Normal appendix.  Retroperitoneum: No mass or adenopathy.  Peritoneum: No free fluid or gas.  Vascular: No acute abnormality.  OSSEOUS: No acute abnormalities.  IMPRESSION: Obstructing 11 mm stone at the right ureteropelvic junction.   Electronically Signed   By: Tiburcio Pea M.D.   On: 03/06/2014 05:28   Dg Abd 1 View  03/06/2014   CLINICAL DATA:  Right ureteral stone  EXAM: ABDOMEN - 1 VIEW  COMPARISON:  CT from earlier in the same day.  FINDINGS: The known proximal right ureteral stone is again identified and stable in appearance. No distal ureteral calculi are seen. The bony structures are within normal limits. Scattered large and small bowel gas is noted.  IMPRESSION: Stable proximal right ureteral stone.   Electronically Signed   By: Alcide Clever M.D.   On: 03/06/2014 07:30    Brief H and P: For complete details please refer to admission H and P.  Hospital Course:  Active Problems:   Right ureteral stone   Hydronephrosis   Leukocytosis, unspecified   Allergic rhinitis   History of cigar smoking   GERD (gastroesophageal reflux disease)   AKI (acute kidney injury)   Day of Discharge  BP 117/75  Pulse 53  Temp(Src) 98.2 F (36.8 C) (Oral)  Resp 16  Ht 6\' 3"  (1.905 m)  Wt 105.6 kg (232 lb 12.9 oz)  BMI 29.10 kg/m2  SpO2 97%  No results found for this or any previous visit (from the past 24 hour(s)).  Physical Exam: General: Alert and awake oriented x3 not in any acute distress. HEENT: anicteric sclera, pupils reactive to light and accommodation CVS: S1-S2 clear no murmur rubs or gallops Chest: clear to  auscultation bilaterally, no wheezing rales or rhonchi Abdomen: soft nontender, nondistended, normal bowel sounds, no organomegaly Extremities: no cyanosis, clubbing or edema noted bilaterally Neuro: Cranial nerves II-XII intact, no focal neurological deficits  Disposition: stone rt upper ureter Diet:  regular  Activity:  Resume normal activity   Disposition and Follow-up:     Discharge Orders   Future Orders Complete By Expires   Call MD for:  difficulty breathing, headache or visual disturbances  As directed    Call MD for:  extreme fatigue  As directed    Call MD for:  hives  As directed    Call MD for:  persistant dizziness or light-headedness  As directed    Call MD for:  persistant nausea and vomiting  As directed    Call MD for:  severe uncontrolled pain  As directed    Call MD for:  temperature >100.4  As directed    Diet general  As directed    Discharge instructions  As directed    Driving Restrictions  As directed    Increase activity slowly  As directed        Discharge Medications:   Medication List         fluticasone 27.5 MCG/SPRAY nasal spray  Commonly known as:  VERAMYST  Place 2 sprays into the nose every morning.     morphine 30 MG tablet  Commonly known as:  MSIR  Take 1 tablet (30 mg total) by mouth every 4 (four) hours as needed for moderate pain or severe pain.     omeprazole 40 MG capsule  Commonly known as:  PRILOSEC  Take 40 mg by mouth daily. Take on an empty stomach 30 minutes before breakfast.     promethazine 12.5 MG tablet  Commonly known as:  PHENERGAN  Take 1 tablet (12.5 mg total) by mouth every 6 (six) hours as needed for nausea or vomiting.         Significant Diagnostic Studies:  Ct Abdomen Pelvis Wo Contrast  03/06/2014   CLINICAL DATA:  Right flank pain.  EXAM: CT ABDOMEN AND PELVIS WITHOUT CONTRAST  TECHNIQUE: Multidetector CT imaging of the abdomen and pelvis was performed following the standard protocol without  intravenous contrast.  COMPARISON:  None.  FINDINGS: BODY WALL: Thickening deep to the right inguinal ring is likely from repair of an inguinal hernia given overlying cutaneous scar. The right testicle was intra scrotal on testicular ultrasound in 2012.  LOWER CHEST: Borderline circumferential distal esophageal thickening.  ABDOMEN/PELVIS:  Liver: Imaged portions unremarkable.  Biliary: No evidence of biliary obstruction or stone.  Pancreas: Unremarkable.  Spleen: Unremarkable.  Adrenals: Unremarkable.  Kidneys and ureters: 11 mm stone at the right ureteral pelvic junction with obstructive uropathy including hydronephrosis and asymmetric perinephric edema. There is an additional punctate stone in the lower pole right kidney. No left-sided hydronephrosis or urolithiasis.  Bladder: Unremarkable.  Reproductive: Unremarkable.  Bowel: No obstruction. Normal appendix.  Retroperitoneum: No mass or adenopathy.  Peritoneum:  No free fluid or gas.  Vascular: No acute abnormality.  OSSEOUS: No acute abnormalities.  IMPRESSION: Obstructing 11 mm stone at the right ureteropelvic junction.   Electronically Signed   By: Tiburcio PeaJonathan  Watts M.D.   On: 03/06/2014 05:28   Dg Abd 1 View  03/06/2014   CLINICAL DATA:  Right ureteral stone  EXAM: ABDOMEN - 1 VIEW  COMPARISON:  CT from earlier in the same day.  FINDINGS: The known proximal right ureteral stone is again identified and stable in appearance. No distal ureteral calculi are seen. The bony structures are within normal limits. Scattered large and small bowel gas is noted.  IMPRESSION: Stable proximal right ureteral stone.   Electronically Signed   By: Alcide CleverMark  Lukens M.D.   On: 03/06/2014 07:30    Brief H and P: For complete details please refer to admission H and P, Hospital Course:  Active Problems:   Right ureteral stone   Hydronephrosis   Leukocytosis, unspecified   Allergic rhinitis   History of cigar smoking   GERD (gastroesophageal reflux disease)   AKI (acute  kidney injury)   Day of Discharge BP 117/75  Pulse 53  Temp(Src) 98.2 F (36.8 C) (Oral)  Resp 16  Ht 6\' 3"  (1.905 m)  Wt 105.6 kg (232 lb 12.9 oz)  BMI 29.10 kg/m2  SpO2 97%  No results found for this or any previous visit (from the past 24 hour(s)).  Physical Exam: General: Alert and awake oriented x3 not in any acute distress. HEENT: anicteric sclera, pupils reactive to light and accommodation CVS: S1-S2 clear no murmur rubs or gallops Chest: clear to auscultation bilaterally, no wheezing rales or rhonchi Abdomen: soft nontender, nondistended, normal bowel sounds, no organomegaly Extremities: no cyanosis, clubbing or edema noted bilaterally Neuro: Cranial nerves II-XII intact, no focal neurological deficits  Disposition: Discharge home. Plan to do ESL for rt ureteral calculus Diet:  Regular  Activity:  Resume normal activity. Follow up with Dr. Jerre SimonJavaid.   Disposition and Follow-up:     Discharge Orders   Future Orders Complete By Expires   Call MD for:  difficulty breathing, headache or visual disturbances  As directed    Call MD for:  extreme fatigue  As directed    Call MD for:  hives  As directed    Call MD for:  persistant dizziness or light-headedness  As directed    Call MD for:  persistant nausea and vomiting  As directed    Call MD for:  severe uncontrolled pain  As directed    Call MD for:  temperature >100.4  As directed    Diet general  As directed    Discharge instructions  As directed    Driving Restrictions  As directed    Increase activity slowly  As directed

## 2014-04-21 ENCOUNTER — Ambulatory Visit (HOSPITAL_COMMUNITY)
Admission: RE | Admit: 2014-04-21 | Discharge: 2014-04-21 | Disposition: A | Discharge status: Home / Self Care | Payer: Medicaid Other | Source: Ambulatory Visit | Attending: Urology | Admitting: Urology

## 2014-04-21 ENCOUNTER — Other Ambulatory Visit (HOSPITAL_COMMUNITY): Payer: Self-pay | Admitting: Urology

## 2014-04-21 DIAGNOSIS — N201 Calculus of ureter: Secondary | ICD-10-CM

## 2014-05-25 ENCOUNTER — Ambulatory Visit (HOSPITAL_COMMUNITY)
Admission: RE | Admit: 2014-05-25 | Discharge: 2014-05-25 | Disposition: A | Discharge status: Home / Self Care | Payer: Medicaid Other | Source: Ambulatory Visit | Attending: Urology | Admitting: Urology

## 2014-05-25 ENCOUNTER — Other Ambulatory Visit (HOSPITAL_COMMUNITY): Payer: Self-pay | Admitting: Urology

## 2014-05-25 DIAGNOSIS — R109 Unspecified abdominal pain: Secondary | ICD-10-CM

## 2014-05-25 DIAGNOSIS — N201 Calculus of ureter: Secondary | ICD-10-CM

## 2014-09-16 ENCOUNTER — Ambulatory Visit (HOSPITAL_COMMUNITY)
Admission: RE | Admit: 2014-09-16 | Discharge: 2014-09-16 | Disposition: A | Discharge status: Home / Self Care | Payer: Medicaid Other | Source: Ambulatory Visit | Attending: Urology | Admitting: Urology

## 2014-09-16 ENCOUNTER — Other Ambulatory Visit (HOSPITAL_COMMUNITY): Payer: Self-pay | Admitting: Urology

## 2014-09-16 DIAGNOSIS — R109 Unspecified abdominal pain: Secondary | ICD-10-CM | POA: Insufficient documentation

## 2014-09-16 DIAGNOSIS — N201 Calculus of ureter: Secondary | ICD-10-CM

## 2014-09-22 ENCOUNTER — Other Ambulatory Visit (HOSPITAL_COMMUNITY): Payer: Self-pay | Admitting: Urology

## 2014-09-22 DIAGNOSIS — N201 Calculus of ureter: Secondary | ICD-10-CM

## 2014-09-22 DIAGNOSIS — R109 Unspecified abdominal pain: Secondary | ICD-10-CM

## 2014-09-23 ENCOUNTER — Other Ambulatory Visit (HOSPITAL_COMMUNITY): Payer: Medicaid Other

## 2014-09-28 ENCOUNTER — Ambulatory Visit (HOSPITAL_COMMUNITY)
Admission: RE | Admit: 2014-09-28 | Discharge: 2014-09-28 | Disposition: A | Discharge status: Home / Self Care | Payer: Medicaid Other | Source: Ambulatory Visit | Attending: Urology | Admitting: Urology

## 2014-09-28 DIAGNOSIS — N201 Calculus of ureter: Secondary | ICD-10-CM | POA: Diagnosis not present

## 2014-09-28 DIAGNOSIS — N1330 Unspecified hydronephrosis: Secondary | ICD-10-CM | POA: Diagnosis not present

## 2014-09-28 DIAGNOSIS — R109 Unspecified abdominal pain: Secondary | ICD-10-CM | POA: Diagnosis present

## 2015-06-30 ENCOUNTER — Emergency Department (HOSPITAL_COMMUNITY)
Admission: EM | Admit: 2015-06-30 | Discharge: 2015-06-30 | Disposition: A | Discharge status: Home / Self Care | Payer: Medicaid Other | Attending: Emergency Medicine | Admitting: Emergency Medicine

## 2015-06-30 ENCOUNTER — Encounter (HOSPITAL_COMMUNITY): Payer: Self-pay

## 2015-06-30 ENCOUNTER — Emergency Department (HOSPITAL_COMMUNITY): Payer: Medicaid Other

## 2015-06-30 DIAGNOSIS — Z720 Tobacco use: Secondary | ICD-10-CM | POA: Diagnosis not present

## 2015-06-30 DIAGNOSIS — Z79899 Other long term (current) drug therapy: Secondary | ICD-10-CM | POA: Diagnosis not present

## 2015-06-30 DIAGNOSIS — N201 Calculus of ureter: Secondary | ICD-10-CM | POA: Diagnosis not present

## 2015-06-30 DIAGNOSIS — R109 Unspecified abdominal pain: Secondary | ICD-10-CM | POA: Diagnosis present

## 2015-06-30 DIAGNOSIS — K219 Gastro-esophageal reflux disease without esophagitis: Secondary | ICD-10-CM | POA: Diagnosis not present

## 2015-06-30 LAB — BASIC METABOLIC PANEL
ANION GAP: 9 (ref 5–15)
BUN: 19 mg/dL (ref 6–20)
CO2: 25 mmol/L (ref 22–32)
CREATININE: 1.24 mg/dL (ref 0.61–1.24)
Calcium: 9.1 mg/dL (ref 8.9–10.3)
Chloride: 106 mmol/L (ref 101–111)
GFR calc non Af Amer: >60 mL/min (ref >60)
Glucose, Bld: 102 mg/dL — ABNORMAL HIGH (ref 65–99)
POTASSIUM: 3.4 mmol/L — AB (ref 3.5–5.1)
Sodium: 140 mmol/L (ref 135–145)

## 2015-06-30 LAB — CBC WITH DIFFERENTIAL/PLATELET
BASOS ABS: 0.1 10*3/uL (ref 0.0–0.1)
BASOS PCT: 1 % (ref 0–1)
Eosinophils Absolute: 0.4 10*3/uL (ref 0.0–0.7)
Eosinophils Relative: 3 % (ref 0–5)
HCT: 42.4 % (ref 39.0–52.0)
HEMOGLOBIN: 14.9 g/dL (ref 13.0–17.0)
LYMPHS ABS: 3.4 10*3/uL (ref 0.7–4.0)
Lymphocytes Relative: 31 % (ref 12–46)
MCH: 29.9 pg (ref 26.0–34.0)
MCHC: 35.1 g/dL (ref 30.0–36.0)
MCV: 85.1 fL (ref 78.0–100.0)
Monocytes Absolute: 1.1 10*3/uL — ABNORMAL HIGH (ref 0.1–1.0)
Monocytes Relative: 10 % (ref 3–12)
Neutro Abs: 5.9 10*3/uL (ref 1.7–7.7)
Neutrophils Relative %: 55 % (ref 43–77)
Platelets: 225 10*3/uL (ref 150–400)
RBC: 4.98 MIL/uL (ref 4.22–5.81)
RDW: 13.1 % (ref 11.5–15.5)
WBC: 10.8 10*3/uL — AB (ref 4.0–10.5)

## 2015-06-30 MED ORDER — NAPROXEN 500 MG PO TABS: 500.0000 mg | ORAL_TABLET | Freq: Two times a day (BID) | ORAL | Status: DC

## 2015-06-30 MED ORDER — OXYCODONE-ACETAMINOPHEN 5-325 MG PO TABS: 1.0000 | ORAL_TABLET | Freq: Four times a day (QID) | ORAL | Status: DC | PRN

## 2015-06-30 MED ORDER — FENTANYL CITRATE (PF) 100 MCG/2ML IJ SOLN
50.0000 ug | Freq: Once | INTRAMUSCULAR | Status: AC
  Administered 2015-06-30: 50 ug via INTRAVENOUS
  Filled 2015-06-30: qty 2

## 2015-06-30 MED ORDER — OXYCODONE-ACETAMINOPHEN 5-325 MG PO TABS: 1.0000 | ORAL_TABLET | ORAL | Status: DC | PRN

## 2015-06-30 MED ORDER — HYDROCODONE-ACETAMINOPHEN 5-325 MG PO TABS: 1.0000 | ORAL_TABLET | Freq: Four times a day (QID) | ORAL | Status: DC | PRN

## 2015-06-30 MED ORDER — SODIUM CHLORIDE 0.9 % IV SOLN
INTRAVENOUS | Status: DC
  Administered 2015-06-30: 08:00:00 via INTRAVENOUS

## 2015-06-30 MED ORDER — ONDANSETRON HCL 4 MG/2ML IJ SOLN
4.0000 mg | Freq: Once | INTRAMUSCULAR | Status: AC
  Administered 2015-06-30: 4 mg via INTRAVENOUS
  Filled 2015-06-30: qty 2

## 2015-06-30 MED ORDER — KETOROLAC TROMETHAMINE 30 MG/ML IJ SOLN
30.0000 mg | Freq: Once | INTRAMUSCULAR | Status: AC
  Administered 2015-06-30: 30 mg via INTRAVENOUS
  Filled 2015-06-30: qty 1

## 2015-06-30 MED ORDER — SODIUM CHLORIDE 0.9 % IV BOLUS (SEPSIS)
1000.0000 mL | Freq: Once | INTRAVENOUS | Status: AC
  Administered 2015-06-30: 1000 mL via INTRAVENOUS
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
## 2015-06-30 NOTE — Discharge Instructions (Signed)
Significant right ureteral kidney stone persists. Close and early follow-up with urology important. Phone number provided to [facility]. They also do have appointment times appear. Take the medications as directed. Return for any new or worse symptoms.

## 2015-06-30 NOTE — ED Notes (Signed)
Pt c/o r flank pain x 3 days with nausea.  Vomited x 1 today.  Reports history of kidney stones.

## 2015-07-08 ENCOUNTER — Other Ambulatory Visit: Payer: Self-pay | Admitting: Urology

## 2015-07-09 ENCOUNTER — Encounter (HOSPITAL_COMMUNITY): Payer: Self-pay | Admitting: General Practice
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
## 2015-07-12 ENCOUNTER — Encounter (HOSPITAL_COMMUNITY): Payer: Self-pay

## 2015-07-12 ENCOUNTER — Emergency Department (HOSPITAL_COMMUNITY)
Admission: EM | Admit: 2015-07-12 | Discharge: 2015-07-12 | Disposition: A | Discharge status: Home / Self Care | Payer: Medicaid Other | Attending: Emergency Medicine | Admitting: Emergency Medicine

## 2015-07-12 ENCOUNTER — Encounter (HOSPITAL_COMMUNITY)
Admission: EM | Disposition: A | Discharge status: Home / Self Care | Payer: Self-pay | Source: Home / Self Care | Attending: Emergency Medicine

## 2015-07-12 ENCOUNTER — Other Ambulatory Visit: Payer: Self-pay

## 2015-07-12 ENCOUNTER — Emergency Department (HOSPITAL_COMMUNITY): Payer: Medicaid Other

## 2015-07-12 ENCOUNTER — Ambulatory Visit (HOSPITAL_COMMUNITY): Admission: RE | Admit: 2015-07-12 | Payer: Medicaid Other | Source: Ambulatory Visit | Admitting: Urology

## 2015-07-12 DIAGNOSIS — Z7951 Long term (current) use of inhaled steroids: Secondary | ICD-10-CM | POA: Diagnosis not present

## 2015-07-12 DIAGNOSIS — J189 Pneumonia, unspecified organism: Secondary | ICD-10-CM

## 2015-07-12 DIAGNOSIS — K219 Gastro-esophageal reflux disease without esophagitis: Secondary | ICD-10-CM | POA: Diagnosis not present

## 2015-07-12 DIAGNOSIS — Z87442 Personal history of urinary calculi: Secondary | ICD-10-CM | POA: Insufficient documentation

## 2015-07-12 DIAGNOSIS — Z79899 Other long term (current) drug therapy: Secondary | ICD-10-CM | POA: Diagnosis not present

## 2015-07-12 DIAGNOSIS — Z791 Long term (current) use of non-steroidal anti-inflammatories (NSAID): Secondary | ICD-10-CM | POA: Diagnosis not present

## 2015-07-12 DIAGNOSIS — Z720 Tobacco use: Secondary | ICD-10-CM | POA: Diagnosis not present

## 2015-07-12 DIAGNOSIS — J159 Unspecified bacterial pneumonia: Secondary | ICD-10-CM | POA: Diagnosis not present

## 2015-07-12 DIAGNOSIS — R079 Chest pain, unspecified: Secondary | ICD-10-CM | POA: Diagnosis present

## 2015-07-12 SURGERY — LITHOTRIPSY, ESWL
Anesthesia: LOCAL | Laterality: Right

## 2015-07-12 MED ORDER — ALBUTEROL SULFATE HFA 108 (90 BASE) MCG/ACT IN AERS: 2.0000 | INHALATION_SPRAY | RESPIRATORY_TRACT | Status: DC | PRN

## 2015-07-12 MED ORDER — LEVOFLOXACIN 750 MG PO TABS: 750.0000 mg | ORAL_TABLET | Freq: Every day | ORAL | Status: DC

## 2015-07-12 NOTE — Progress Notes (Signed)
Spoke with pt over the phone, he states he is feeling good.  He states he is currently in the Urology office waiting to see the doctor.  Pt was supposed to arrive in short stay at 0915 for a 1115 lithotripsy.
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
## 2015-07-12 NOTE — Discharge Instructions (Signed)
Pneumonia, Adult  Pneumonia is an infection of the lungs. It may be caused by a germ (virus or bacteria). Some types of pneumonia can spread easily from person to person. This can happen when you cough or sneeze.  HOME CARE   Only take medicine as told by your doctor.   Take your medicine (antibiotics) as told. Finish it even if you start to feel better.   Do not smoke.   You may use a vaporizer or humidifier in your room. This can help loosen thick spit (mucus).   Sleep so you are almost sitting up (semi-upright). This helps reduce coughing.   Rest.  A shot (vaccine) can help prevent pneumonia. Shots are often advised for:   People over 65 years old.   Patients on chemotherapy.   People with long-term (chronic) lung problems.   People with immune system problems.  GET HELP RIGHT AWAY IF:    You are getting worse.   You cannot control your cough, and you are losing sleep.   You cough up blood.   Your pain gets worse, even with medicine.   You have a fever.   Any of your problems are getting worse, not better.   You have shortness of breath or chest pain.  MAKE SURE YOU:    Understand these instructions.   Will watch your condition.   Will get help right away if you are not doing well or get worse.  Document Released: 05/01/2008 Document Revised: 02/05/2012 Document Reviewed: 02/03/2011  ExitCare Patient Information 2015 [employer]. This information is not intended to replace advice given to you by your health care provider. Make sure you discuss any questions you have with your health care provider.

## 2015-07-12 NOTE — ED Notes (Signed)
Pt states chest pain since yesterday.  Cough during assessment.  Pt states no fever but chills yesterday.  Chest pain is constant but worse with breathing.

## 2016-06-29 ENCOUNTER — Emergency Department (HOSPITAL_COMMUNITY): Payer: Medicaid Other

## 2016-06-29 ENCOUNTER — Emergency Department (HOSPITAL_COMMUNITY)
Admission: EM | Admit: 2016-06-29 | Discharge: 2016-06-29 | Disposition: A | Discharge status: Home / Self Care | Payer: Medicaid Other | Attending: Emergency Medicine | Admitting: Emergency Medicine

## 2016-06-29 ENCOUNTER — Encounter (HOSPITAL_COMMUNITY): Payer: Self-pay | Admitting: Emergency Medicine

## 2016-06-29 DIAGNOSIS — F17210 Nicotine dependence, cigarettes, uncomplicated: Secondary | ICD-10-CM | POA: Diagnosis not present

## 2016-06-29 DIAGNOSIS — Y92410 Unspecified street and highway as the place of occurrence of the external cause: Secondary | ICD-10-CM | POA: Diagnosis not present

## 2016-06-29 DIAGNOSIS — R079 Chest pain, unspecified: Secondary | ICD-10-CM | POA: Diagnosis not present

## 2016-06-29 DIAGNOSIS — M25511 Pain in right shoulder: Secondary | ICD-10-CM | POA: Diagnosis present

## 2016-06-29 DIAGNOSIS — W102XXA Fall (on)(from) incline, initial encounter: Secondary | ICD-10-CM

## 2016-06-29 DIAGNOSIS — M79642 Pain in left hand: Secondary | ICD-10-CM | POA: Diagnosis not present

## 2016-06-29 DIAGNOSIS — Y9389 Activity, other specified: Secondary | ICD-10-CM | POA: Diagnosis not present

## 2016-06-29 DIAGNOSIS — M542 Cervicalgia: Secondary | ICD-10-CM | POA: Diagnosis not present

## 2016-06-29 DIAGNOSIS — Y999 Unspecified external cause status: Secondary | ICD-10-CM | POA: Diagnosis not present

## 2016-06-29 LAB — I-STAT CHEM 8, ED
BUN: 20 mg/dL (ref 6–20)
CREATININE: 1 mg/dL (ref 0.61–1.24)
Calcium, Ion: 1.15 mmol/L (ref 1.13–1.30)
Chloride: 101 mmol/L (ref 101–111)
Glucose, Bld: 107 mg/dL — ABNORMAL HIGH (ref 65–99)
HCT: 47 % (ref 39.0–52.0)
HEMOGLOBIN: 16 g/dL (ref 13.0–17.0)
POTASSIUM: 3.6 mmol/L (ref 3.5–5.1)
Sodium: 140 mmol/L (ref 135–145)
TCO2: 26 mmol/L (ref 0–100)

## 2016-06-29 MED ORDER — ONDANSETRON 4 MG PO TBDP: ORAL_TABLET | ORAL | 0 refills | Status: DC

## 2016-06-29 MED ORDER — OXYCODONE-ACETAMINOPHEN 5-325 MG PO TABS: 1.0000 | ORAL_TABLET | Freq: Four times a day (QID) | ORAL | 0 refills | Status: DC | PRN

## 2016-06-29 MED ORDER — BACITRACIN ZINC 500 UNIT/GM EX OINT
TOPICAL_OINTMENT | CUTANEOUS | Status: AC
  Filled 2016-06-29: qty 0.9

## 2016-06-29 MED ORDER — ONDANSETRON HCL 4 MG/2ML IJ SOLN
4.0000 mg | Freq: Once | INTRAMUSCULAR | Status: AC
  Administered 2016-06-29: 4 mg via INTRAVENOUS
  Filled 2016-06-29: qty 2

## 2016-06-29 MED ORDER — HYDROMORPHONE HCL 1 MG/ML IJ SOLN
1.0000 mg | Freq: Once | INTRAMUSCULAR | Status: AC
  Administered 2016-06-29: 1 mg via INTRAVENOUS
  Filled 2016-06-29: qty 1

## 2016-06-29 MED ORDER — SODIUM CHLORIDE 0.9 % IV BOLUS (SEPSIS)
500.0000 mL | Freq: Once | INTRAVENOUS | Status: AC
  Administered 2016-06-29: 500 mL via INTRAVENOUS

## 2016-06-29 MED ORDER — OXYCODONE-ACETAMINOPHEN 5-325 MG PO TABS: 1.0000 | ORAL_TABLET | ORAL | 0 refills | Status: DC | PRN

## 2016-06-29 MED ORDER — BACITRACIN ZINC 500 UNIT/GM EX OINT
TOPICAL_OINTMENT | CUTANEOUS | Status: AC
  Administered 2016-06-29: 20:00:00
  Filled 2016-06-29: qty 0.9
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
## 2016-06-29 NOTE — ED Notes (Signed)
Pt returned from xray. Nad.  

## 2016-06-29 NOTE — Discharge Instructions (Signed)
Follow-up with Dr. Romeo Apple next week. Wear your sling
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
## 2016-07-04 ENCOUNTER — Ambulatory Visit (INDEPENDENT_AMBULATORY_CARE_PROVIDER_SITE_OTHER): Payer: Medicaid Other | Admitting: Orthopaedic Surgery

## 2016-07-04 ENCOUNTER — Encounter: Payer: Self-pay | Admitting: Orthopaedic Surgery

## 2016-07-04 VITALS — BP 109/59 | HR 55 | Temp 98.1°F | Ht 72.5 in | Wt 206.8 lb

## 2016-07-04 DIAGNOSIS — S43101A Unspecified dislocation of right acromioclavicular joint, initial encounter: Secondary | ICD-10-CM
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
## 2016-07-05 ENCOUNTER — Telehealth: Payer: Self-pay | Admitting: Orthopaedic Surgery

## 2016-07-05 NOTE — Telephone Encounter (Signed)
Patient is requesting something for pain. He stated the ibuprofen isn't helping at all.

## 2016-07-06 NOTE — Telephone Encounter (Signed)
He has listed allergy to codeine, hydrocodone and also Tylenol.  Ibuprofen is one of the few pain medicines he can take.  Take three Advil every six hours as needed for pain.

## 2016-07-18 ENCOUNTER — Encounter: Payer: Self-pay | Admitting: Orthopaedic Surgery

## 2016-07-18 ENCOUNTER — Ambulatory Visit (INDEPENDENT_AMBULATORY_CARE_PROVIDER_SITE_OTHER): Payer: Medicaid Other | Admitting: Orthopaedic Surgery

## 2016-07-18 ENCOUNTER — Ambulatory Visit (INDEPENDENT_AMBULATORY_CARE_PROVIDER_SITE_OTHER): Payer: Medicaid Other

## 2016-07-18 VITALS — BP 110/67 | HR 63 | Temp 97.9°F | Ht 72.25 in | Wt 206.0 lb

## 2016-07-18 DIAGNOSIS — F17200 Nicotine dependence, unspecified, uncomplicated: Secondary | ICD-10-CM

## 2016-07-18 DIAGNOSIS — S43101D Unspecified dislocation of right acromioclavicular joint, subsequent encounter: Secondary | ICD-10-CM

## 2016-07-18 MED ORDER — IBUPROFEN 600 MG PO TABS: 600.0000 mg | ORAL_TABLET | Freq: Four times a day (QID) | ORAL | 0 refills | Status: AC | PRN

## 2016-07-18 NOTE — Addendum Note (Signed)
Addended by: Gevena CottonSACKFIELD, Grete Bosko L on: 07/18/2016 02:10 PM   Modules accepted: Orders
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
## 2016-07-18 NOTE — Patient Instructions (Signed)
Smoking Cessation, Tips for Success If you are ready to quit smoking, congratulations! You have chosen to help yourself be healthier. Cigarettes bring nicotine, tar, carbon monoxide, and other irritants into your body. Your lungs, heart, and blood vessels will be able to work better without these poisons. There are many different ways to quit smoking. Nicotine gum, nicotine patches, a nicotine inhaler, or nicotine nasal spray can help with physical craving. Hypnosis, support groups, and medicines help break the habit of smoking. WHAT THINGS CAN I DO TO MAKE QUITTING EASIER?  Here are some tips to help you quit for good:  Pick a date when you will quit smoking completely. Tell all of your friends and family about your plan to quit on that date.  Do not try to slowly cut down on the number of cigarettes you are smoking. Pick a quit date and quit smoking completely starting on that day.  Throw away all cigarettes.   Clean and remove all ashtrays from your home, work, and car.  On a card, write down your reasons for quitting. Carry the card with you and read it when you get the urge to smoke.  Cleanse your body of nicotine. Drink enough water and fluids to keep your urine clear or pale yellow. Do this after quitting to flush the nicotine from your body.  Learn to predict your moods. Do not let a bad situation be your excuse to have a cigarette. Some situations in your life might tempt you into wanting a cigarette.  Never have "just one" cigarette. It leads to wanting another and another. Remind yourself of your decision to quit.  Change habits associated with smoking. If you smoked while driving or when feeling stressed, try other activities to replace smoking. Stand up when drinking your coffee. Brush your teeth after eating. Sit in a different chair when you read the paper. Avoid alcohol while trying to quit, and try to drink fewer caffeinated beverages. Alcohol and caffeine may urge you to  smoke.  Avoid foods and drinks that can trigger a desire to smoke, such as sugary or spicy foods and alcohol.  Ask people who smoke not to smoke around you.  Have something planned to do right after eating or having a cup of coffee. For example, plan to take a walk or exercise.  Try a relaxation exercise to calm you down and decrease your stress. Remember, you may be tense and nervous for the first 2 weeks after you quit, but this will pass.  Find new activities to keep your hands busy. Play with a pen, coin, or rubber band. Doodle or draw things on paper.  Brush your teeth right after eating. This will help cut down on the craving for the taste of tobacco after meals. You can also try mouthwash.   Use oral substitutes in place of cigarettes. Try using lemon drops, carrots, cinnamon sticks, or chewing gum. Keep them handy so they are available when you have the urge to smoke.  When you have the urge to smoke, try deep breathing.  Designate your home as a nonsmoking area.  If you are a heavy smoker, ask your health care provider about a prescription for nicotine chewing gum. It can ease your withdrawal from nicotine.  Reward yourself. Set aside the cigarette money you save and buy yourself something nice.  Look for support from others. Join a support group or smoking cessation program. Ask someone at home or at work to help you with your plan   to quit smoking.  Always ask yourself, "Do I need this cigarette or is this just a reflex?" Tell yourself, "Today, I choose not to smoke," or "I do not want to smoke." You are reminding yourself of your decision to quit.  Do not replace cigarette smoking with electronic cigarettes (commonly called e-cigarettes). The safety of e-cigarettes is unknown, and some may contain harmful chemicals.  If you relapse, do not give up! Plan ahead and think about what you will do the next time you get the urge to smoke. HOW WILL I FEEL WHEN I QUIT SMOKING? You  may have symptoms of withdrawal because your body is used to nicotine (the addictive substance in cigarettes). You may crave cigarettes, be irritable, feel very hungry, cough often, get headaches, or have difficulty concentrating. The withdrawal symptoms are only temporary. They are strongest when you first quit but will go away within 10-14 days. When withdrawal symptoms occur, stay in control. Think about your reasons for quitting. Remind yourself that these are signs that your body is healing and getting used to being without cigarettes. Remember that withdrawal symptoms are easier to treat than the major diseases that smoking can cause.  Even after the withdrawal is over, expect periodic urges to smoke. However, these cravings are generally short lived and will go away whether you smoke or not. Do not smoke! WHAT RESOURCES ARE AVAILABLE TO HELP ME QUIT SMOKING? Your health care provider can direct you to community resources or hospitals for support, which may include:  Group support.  Education.  Hypnosis.  Therapy.   This information is not intended to replace advice given to you by your health care provider. Make sure you discuss any questions you have with your health care provider.   Document Released: 08/11/2004 Document Revised: 12/04/2014 Document Reviewed: 05/01/2013 Elsevier Interactive Patient Education 2016 Else[employer] Inc.  

## 2016-07-28 ENCOUNTER — Ambulatory Visit (HOSPITAL_COMMUNITY): Payer: Medicaid Other | Attending: Orthopaedic Surgery | Admitting: Occupational Therapy

## 2016-07-28 ENCOUNTER — Encounter (HOSPITAL_COMMUNITY): Payer: Self-pay | Admitting: Occupational Therapy

## 2016-07-28 DIAGNOSIS — R29898 Other symptoms and signs involving the musculoskeletal system: Secondary | ICD-10-CM | POA: Diagnosis present

## 2016-07-28 DIAGNOSIS — M25511 Pain in right shoulder: Secondary | ICD-10-CM | POA: Diagnosis not present

## 2016-07-28 NOTE — Patient Instructions (Signed)
Scapular A/ROM Exercises  1) Seated Row   Sit up straight with elbows by your sides. Pull back with shoulders/elbows, keeping forearms straight, as if pulling back on the reins of a horse. Squeeze shoulder blades together. Repeat _10-15__times, _1-2___sets/day    2) Shoulder Elevation    Sit up straight with arms by your sides. Slowly bring your shoulders up towards your ears. Repeat_10-15__times, _1-2___ sets/day    3) Shoulder Extension    Sit up straight with both arms by your side, draw your arms back behind your waist. Keep your elbows straight. Repeat _10-15___times, _1-2___sets/day.      A/ROM exercises:   Repeat all exercises 10-15 times, 1-2 times per day.  1) Shoulder Protraction    Begin with elbows by your side, slowly "punch" straight out in front of you keeping arms/elbows straight.      2) Shoulder Flexion  Supine:     Standing:         Begin with arms at your side with thumbs pointed up, slowly raise both arms up and forward towards overhead.               3) Horizontal abduction/adduction  Supine:   Standing:           Begin with arms straight out in front of you, bring out to the side in at "T" shape. Keep arms straight entire time.                 4) Internal & External Rotation    *No band* -Stand with elbows at the side and elbows bent 90 degrees. Move your forearms away from your body, then bring back inward toward the body.     5) Shoulder Abduction  Supine:     Standing:       Lying on your back begin with your arms flat on the table next to your side. Slowly move your arms out to the side so that they go overhead, in a jumping jack or snow angel movement.    6) X to V arms (cheerleader move):  Begin with arms straight down, crossed in front of body in an "X". Keeping arms crossed, lift arms straight up overhead. Then spread arms apart into a "V" shape.  Bring back together into x and lower  down to starting position.    7) W arms:  Begin with elbows bent and even with shoulders, hands pointing to ceiling. Keeping elbows at shoulder level, 1-shrug shoulders up, 2-squeeze shoulder blades together, and 3-relax.        Scapular Theraband Exercises: complete 10-15X each, 1-2X/day  (Home) Extension: Isometric / Bilateral Arm Retraction - Sitting   Facing anchor, hold hands and elbow at shoulder height, with elbow bent.  Pull arms back to squeeze shoulder blades together. Repeat 10-15 times.  Copyright  VHI. All rights reserved.   (Home) Retraction: Row - Bilateral (Anchor)   Facing anchor, arms reaching forward, pull hands toward stomach, keeping elbows bent and at your sides and pinching shoulder blades together. Repeat 10-15 times.  Copyright  VHI. All rights reserved.   (Clinic) Extension / Flexion (Assist)   Face anchor, pull arms back, keeping elbow straight, and squeze shoulder blades together. Repeat 10-15 times.   Copyright  VHI. All rights reserved.          1) Flexion Wall Stretch    Face wall, place affected handon wall in front of you. Slide hand up the wall  and lean body  in towards the wall. Hold for 5 seconds. Repeat 3-5 times. 1-2 times/day.     2) Towel Stretch with Internal Rotation   Gently pull up your affected arm  behind your back with the assist of a towel. Hold for 5 seconds, repeat 3-5X, 1-2X/day            3) Corner Stretch    Stand at a corner of a wall, place your arms on the walls with elbows bent. Lean into the corner until a stretch is felt along the front of your chest and/or shoulders. Hold for 5 seconds. Repeat 3-5X, 1-2 times/day.    4) Posterior Capsule Stretch    Bring the involved arm across chest. Grasp elbow and pull toward chest until you feel a stretch in the back of the upper arm and shoulder. Hold 5 seconds. Repeat 3-5X. Complete 1-2 times/day.    5) Scapular Retraction    Tuck  chin back as you pinch shoulder blades together.  Hold 5 seconds. Repeat 3-5X. Complete 1-2 times/day.    6) External Rotation Stretch:     Place your affected hand on the wall with the elbow bent and gently turn your body the opposite direction until a stretch is felt.  Hold for 5 seconds, 3-5 reps, 1-2 times per day
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
## 2016-08-15 ENCOUNTER — Ambulatory Visit: Payer: Medicaid Other

## 2016-08-15 ENCOUNTER — Encounter: Payer: Self-pay | Admitting: Orthopaedic Surgery

## 2016-08-15 ENCOUNTER — Ambulatory Visit (INDEPENDENT_AMBULATORY_CARE_PROVIDER_SITE_OTHER): Payer: Medicaid Other | Admitting: Orthopaedic Surgery

## 2016-08-15 VITALS — BP 108/70 | HR 62 | Temp 97.7°F | Ht 72.25 in | Wt 205.0 lb

## 2016-08-15 DIAGNOSIS — S43101D Unspecified dislocation of right acromioclavicular joint, subsequent encounter: Secondary | ICD-10-CM
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
## 2018-01-26 IMAGING — DX DG HAND COMPLETE 3+V*L*
3 series · 3 of 3 positions shown · non-contrast
Comparison: None.

EXAM:
LEFT HAND - COMPLETE 3+ VIEW

[hand pa]
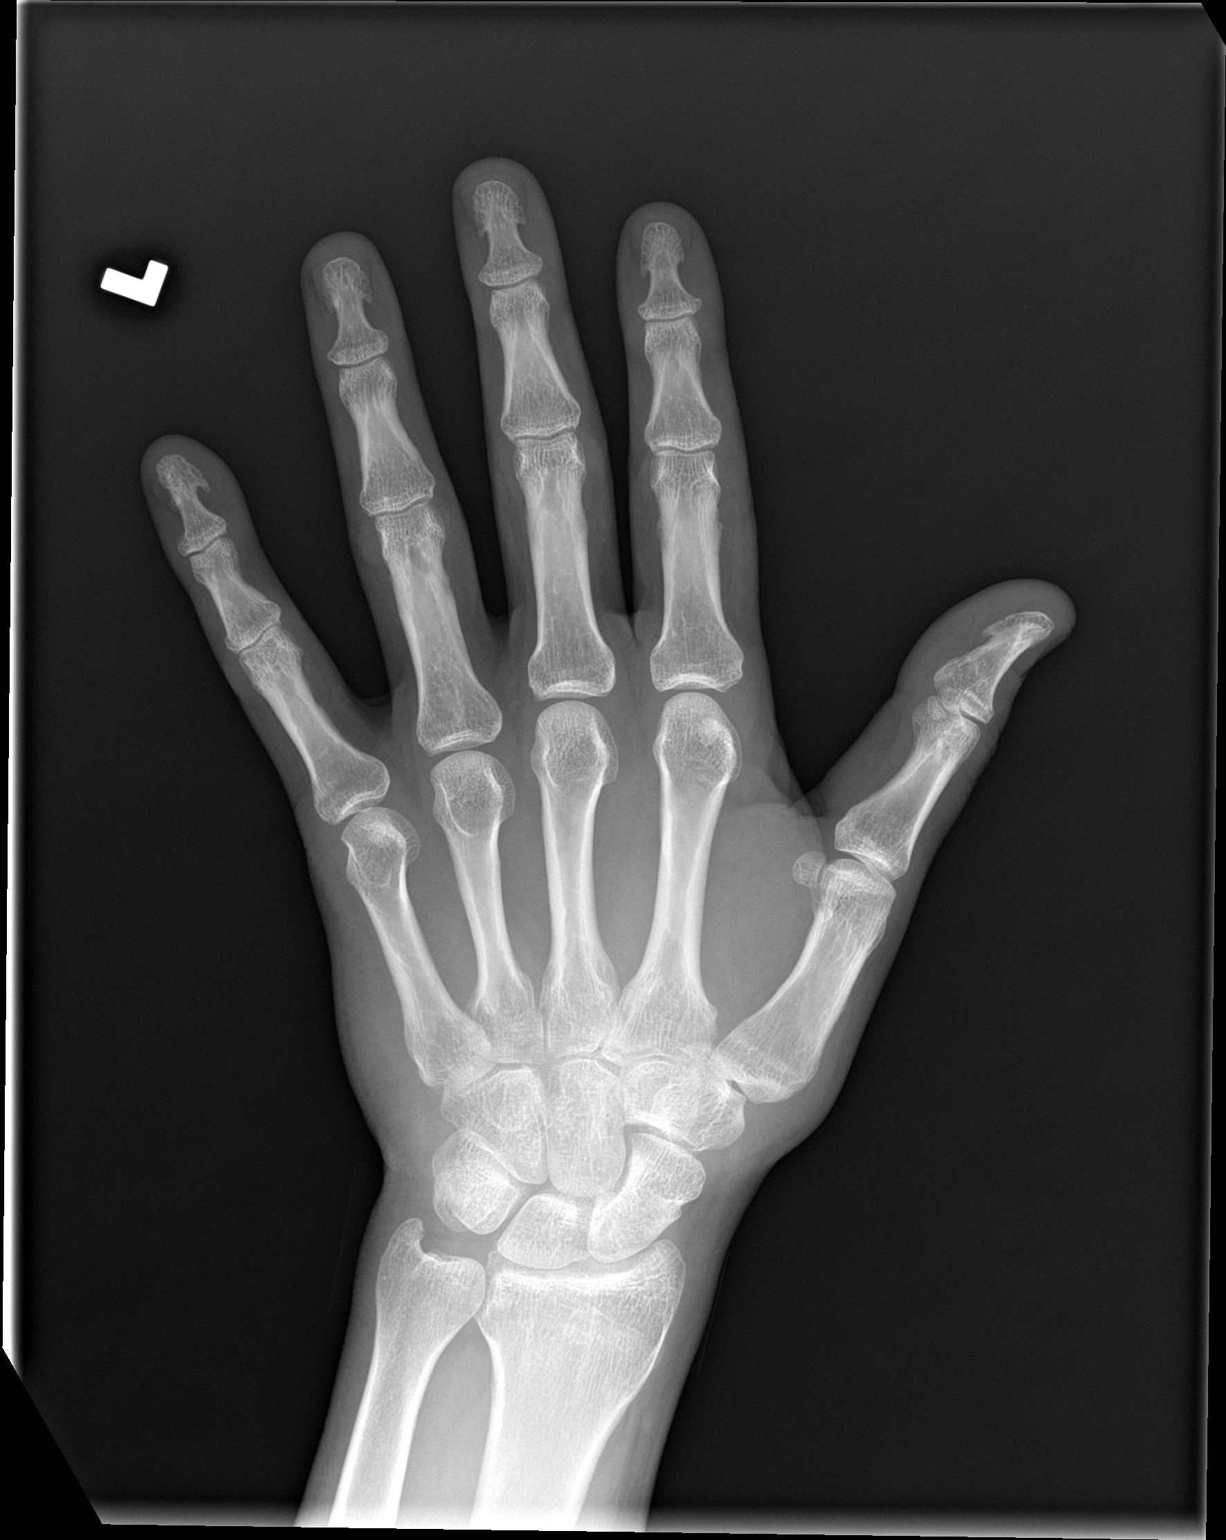

[hand obl]
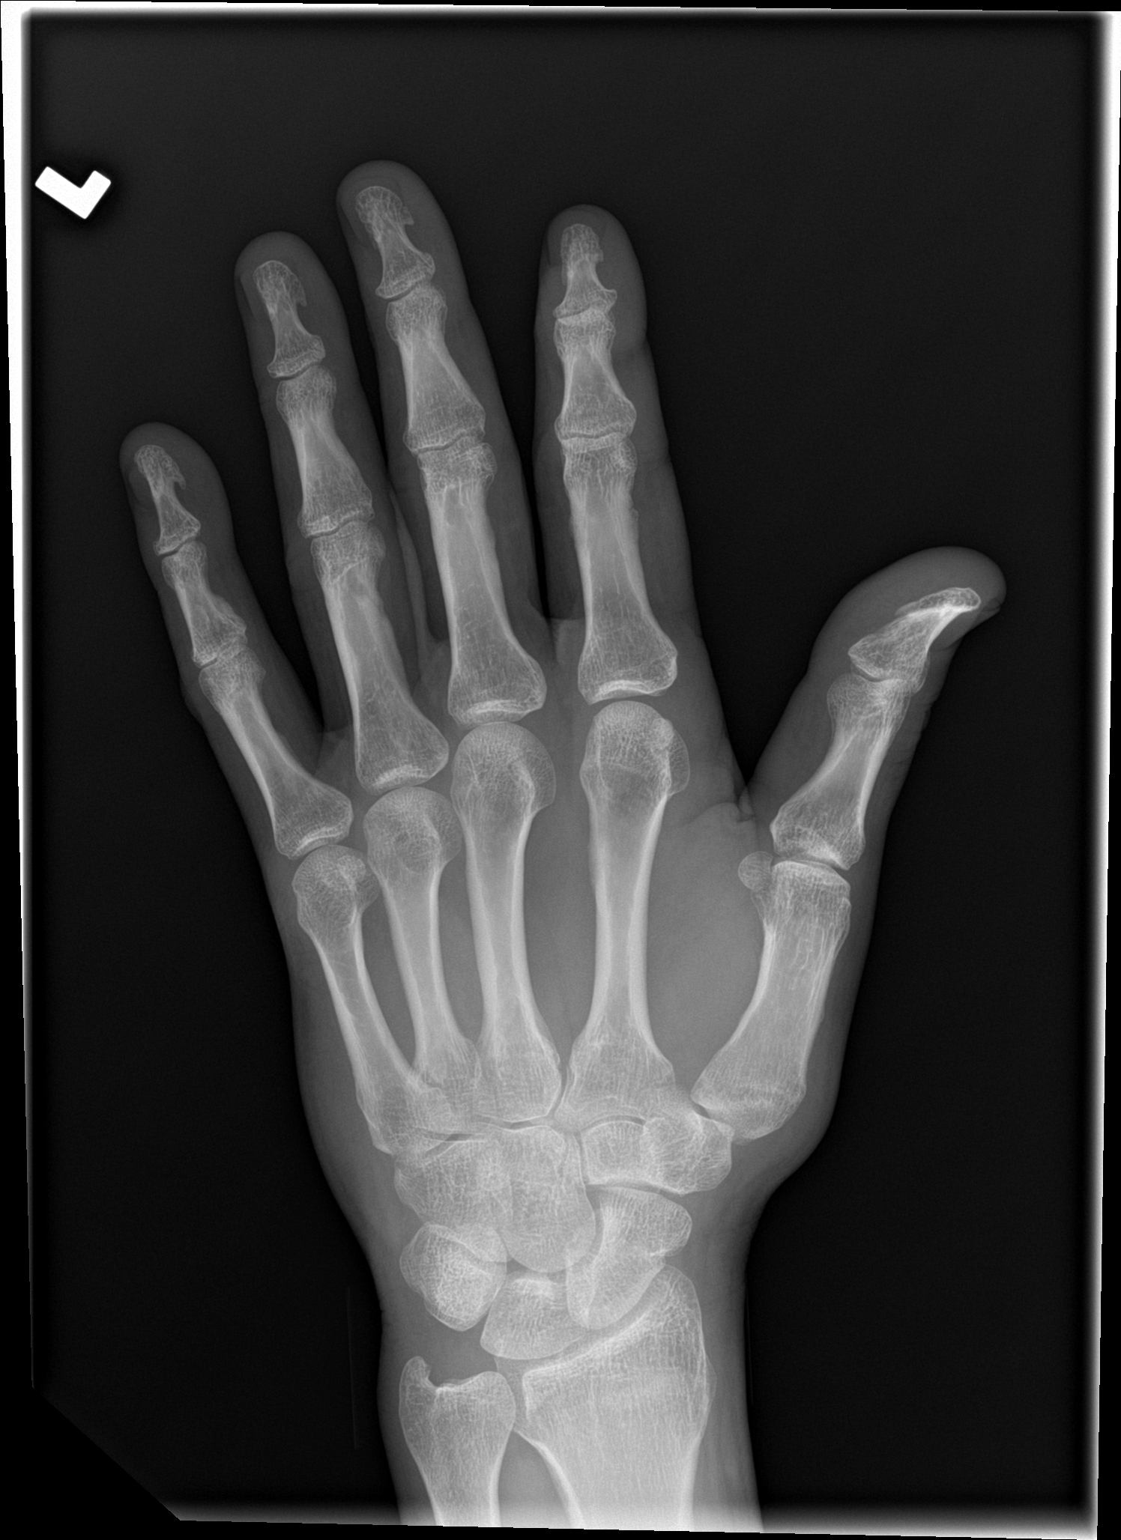

[hand lat]
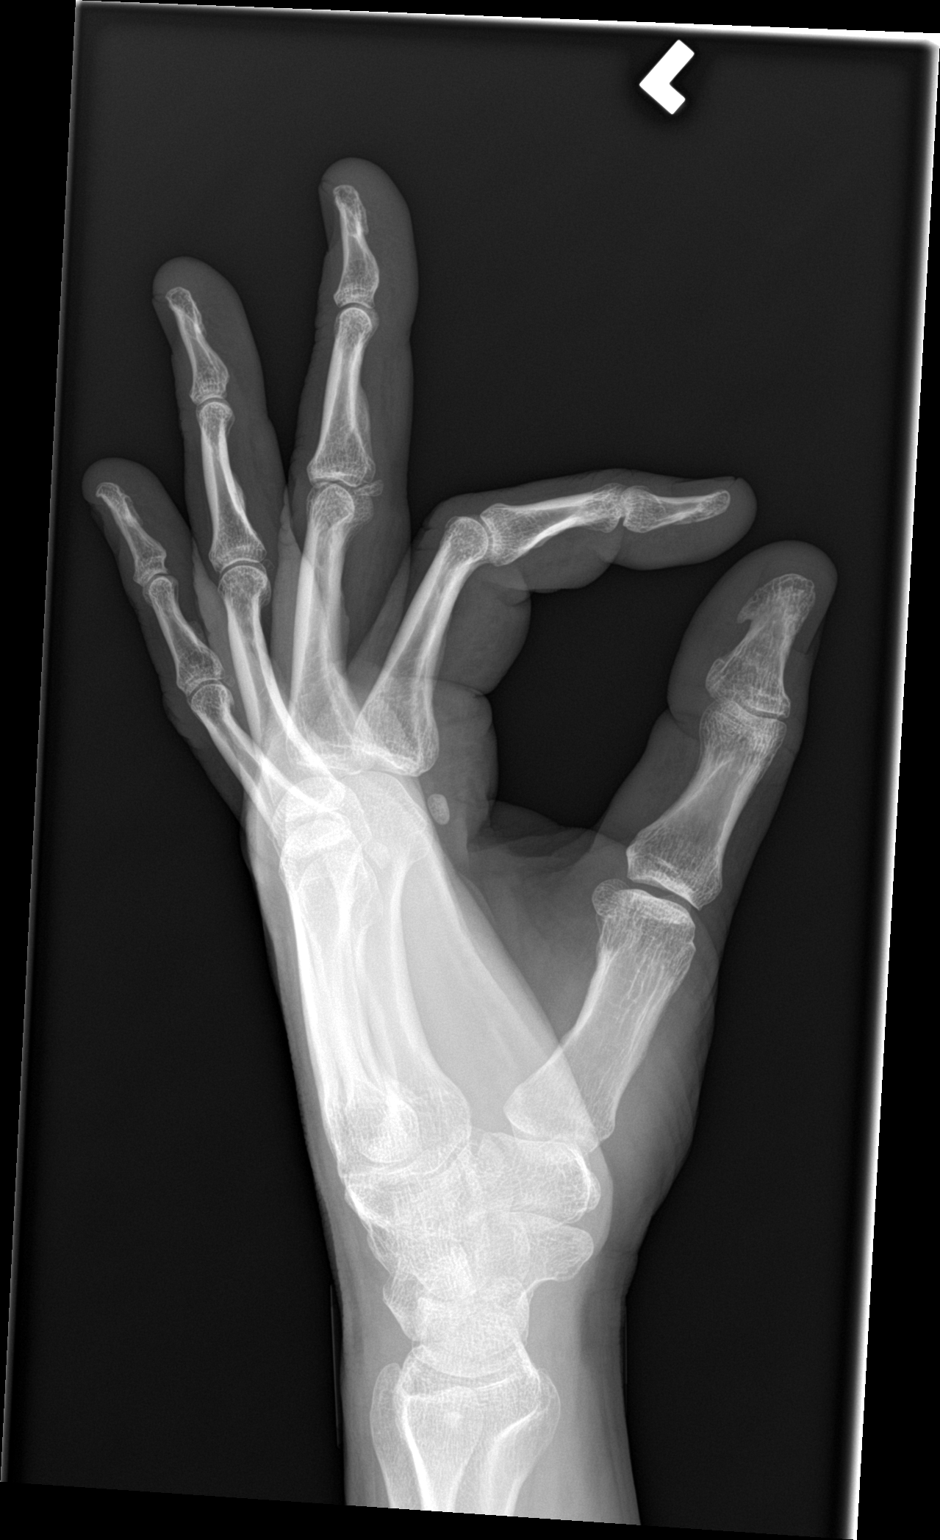

[3 of 3 positions shown; findings below may reference images not displayed]

FINDINGS: No convincing acute fracture. There is a small bone fragment along
the volar base of the middle phalanx of the middle finger, which
appears chronic. There is no associated soft tissue swelling.

Joints are normally spaced and aligned.

Soft tissues are unremarkable.
IMPRESSION: No acute fracture or dislocation.
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
## 2018-11-30 ENCOUNTER — Other Ambulatory Visit: Payer: Self-pay

## 2018-11-30 ENCOUNTER — Emergency Department (HOSPITAL_COMMUNITY)
Admission: EM | Admit: 2018-11-30 | Discharge: 2018-11-30 | Disposition: A | Discharge status: Home / Self Care | Payer: Medicaid Other | Attending: Emergency Medicine | Admitting: Emergency Medicine

## 2018-11-30 ENCOUNTER — Emergency Department (HOSPITAL_COMMUNITY): Payer: Medicaid Other

## 2018-11-30 ENCOUNTER — Encounter (HOSPITAL_COMMUNITY): Payer: Self-pay | Admitting: Emergency Medicine

## 2018-11-30 DIAGNOSIS — J40 Bronchitis, not specified as acute or chronic: Secondary | ICD-10-CM | POA: Diagnosis not present

## 2018-11-30 DIAGNOSIS — R05 Cough: Secondary | ICD-10-CM | POA: Diagnosis present

## 2018-11-30 DIAGNOSIS — J9801 Acute bronchospasm: Secondary | ICD-10-CM | POA: Diagnosis not present

## 2018-11-30 DIAGNOSIS — F17290 Nicotine dependence, other tobacco product, uncomplicated: Secondary | ICD-10-CM | POA: Insufficient documentation

## 2018-11-30 DIAGNOSIS — J209 Acute bronchitis, unspecified: Secondary | ICD-10-CM

## 2018-11-30 DIAGNOSIS — Z79899 Other long term (current) drug therapy: Secondary | ICD-10-CM | POA: Diagnosis not present

## 2018-11-30 MED ORDER — ALBUTEROL SULFATE HFA 108 (90 BASE) MCG/ACT IN AERS
1.0000 | INHALATION_SPRAY | RESPIRATORY_TRACT | Status: DC | PRN
  Administered 2018-11-30: 2 via RESPIRATORY_TRACT
  Filled 2018-11-30: qty 6.7

## 2018-11-30 MED ORDER — BENZONATATE 100 MG PO CAPS: 100.0000 mg | ORAL_CAPSULE | Freq: Three times a day (TID) | ORAL | 0 refills | Status: DC | PRN

## 2018-11-30 MED ORDER — PREDNISONE 20 MG PO TABS: ORAL_TABLET | ORAL | 0 refills | Status: DC
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
## 2018-11-30 NOTE — ED Triage Notes (Signed)
Pt states he has had a productive green cough since Christmas.

## 2018-11-30 NOTE — ED Notes (Signed)
Resp paged for inhaler. 

## 2019-02-01 DIAGNOSIS — F17210 Nicotine dependence, cigarettes, uncomplicated: Secondary | ICD-10-CM | POA: Insufficient documentation

## 2023-03-19 ENCOUNTER — Other Ambulatory Visit: Payer: Self-pay | Admitting: Otolaryngology

## 2023-03-19 DIAGNOSIS — J387 Other diseases of larynx: Secondary | ICD-10-CM

## 2023-04-20 ENCOUNTER — Encounter: Payer: Self-pay | Admitting: Urology

## 2023-04-20 ENCOUNTER — Other Ambulatory Visit: Payer: Self-pay

## 2023-04-20 ENCOUNTER — Ambulatory Visit (HOSPITAL_COMMUNITY)
Admission: RE | Admit: 2023-04-20 | Discharge: 2023-04-20 | Disposition: A | Discharge status: Home / Self Care | Payer: Medicaid Other | Source: Ambulatory Visit | Attending: Urology | Admitting: Urology

## 2023-04-20 ENCOUNTER — Telehealth: Payer: Self-pay

## 2023-04-20 ENCOUNTER — Ambulatory Visit: Payer: Medicaid Other | Admitting: Urology

## 2023-04-20 VITALS — BP 109/76 | HR 76

## 2023-04-20 DIAGNOSIS — N200 Calculus of kidney: Secondary | ICD-10-CM

## 2023-04-20 LAB — URINALYSIS, ROUTINE W REFLEX MICROSCOPIC
Bilirubin, UA: NEGATIVE
Glucose, UA: NEGATIVE
Ketones, UA: NEGATIVE
Leukocytes,UA: NEGATIVE
Nitrite, UA: NEGATIVE
Protein,UA: NEGATIVE
RBC, UA: NEGATIVE
Specific Gravity, UA: 1.02 (ref 1.005–1.030)
Urobilinogen, Ur: 1 mg/dL (ref 0.2–1.0)
pH, UA: 6 (ref 5.0–7.5)

## 2023-04-20 MED ORDER — ONDANSETRON HCL 4 MG PO TABS: 4.0000 mg | ORAL_TABLET | Freq: Three times a day (TID) | ORAL | 0 refills | Status: DC | PRN

## 2023-04-20 MED ORDER — TAMSULOSIN HCL 0.4 MG PO CAPS: 0.4000 mg | ORAL_CAPSULE | Freq: Every day | ORAL | 11 refills | Status: DC

## 2023-04-20 MED ORDER — OXYCODONE-ACETAMINOPHEN 5-325 MG PO TABS: 1.0000 | ORAL_TABLET | ORAL | 0 refills | Status: DC | PRN
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
## 2023-04-20 NOTE — Telephone Encounter (Signed)
Tried calling patient today with no answer. Left message making patient aware to KUB done before scheduled appointment today.

## 2023-04-20 NOTE — Progress Notes (Signed)
I spoke with Tom Humphrey. We have discussed possible surgery dates and 04/24/2023 was agreed upon by all parties. Patient given information about surgery date, what to expect pre-operatively and post operatively.    We discussed that a pre-op nurse will be calling to set up the pre-op visit that will take place prior to surgery. Informed patient that our office will communicate any additional care to be provided after surgery.    Patients questions or concerns were discussed during our call. Advised to call our office should there be any additional information, questions or concerns that arise. Patient verbalized understanding.
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
## 2023-04-24 ENCOUNTER — Encounter (HOSPITAL_COMMUNITY): Payer: Self-pay | Admitting: Urology

## 2023-04-24 ENCOUNTER — Ambulatory Visit (HOSPITAL_COMMUNITY): Payer: Medicaid Other

## 2023-04-24 ENCOUNTER — Encounter (HOSPITAL_COMMUNITY)
Admission: RE | Disposition: A | Discharge status: Home / Self Care | Payer: Self-pay | Source: Home / Self Care | Attending: Urology

## 2023-04-24 ENCOUNTER — Ambulatory Visit (HOSPITAL_COMMUNITY)
Admission: RE | Admit: 2023-04-24 | Discharge: 2023-04-24 | Disposition: A | Discharge status: Home / Self Care | Payer: Medicaid Other | Attending: Urology | Admitting: Urology

## 2023-04-24 ENCOUNTER — Encounter: Payer: Self-pay | Admitting: Urology

## 2023-04-24 DIAGNOSIS — F17290 Nicotine dependence, other tobacco product, uncomplicated: Secondary | ICD-10-CM | POA: Insufficient documentation

## 2023-04-24 DIAGNOSIS — Z79899 Other long term (current) drug therapy: Secondary | ICD-10-CM | POA: Insufficient documentation

## 2023-04-24 DIAGNOSIS — F17210 Nicotine dependence, cigarettes, uncomplicated: Secondary | ICD-10-CM | POA: Insufficient documentation

## 2023-04-24 DIAGNOSIS — N201 Calculus of ureter: Secondary | ICD-10-CM

## 2023-04-24 DIAGNOSIS — N200 Calculus of kidney: Secondary | ICD-10-CM | POA: Insufficient documentation

## 2023-04-24 DIAGNOSIS — K219 Gastro-esophageal reflux disease without esophagitis: Secondary | ICD-10-CM | POA: Insufficient documentation

## 2023-04-24 HISTORY — PX: EXTRACORPOREAL SHOCK WAVE LITHOTRIPSY: SHX1557

## 2023-04-24 SURGERY — LITHOTRIPSY, ESWL
Anesthesia: LOCAL | Laterality: Left

## 2023-04-24 MED ORDER — SODIUM CHLORIDE 0.9 % IV SOLN: INTRAVENOUS | Status: DC

## 2023-04-24 MED ORDER — TAMSULOSIN HCL 0.4 MG PO CAPS: 0.4000 mg | ORAL_CAPSULE | Freq: Every day | ORAL | 11 refills | Status: AC

## 2023-04-24 MED ORDER — DIPHENHYDRAMINE HCL 25 MG PO CAPS
25.0000 mg | ORAL_CAPSULE | ORAL | Status: AC
  Administered 2023-04-24: 25 mg via ORAL
  Filled 2023-04-24: qty 1

## 2023-04-24 MED ORDER — OXYCODONE-ACETAMINOPHEN 5-325 MG PO TABS: 1.0000 | ORAL_TABLET | ORAL | 0 refills | Status: AC | PRN

## 2023-04-24 MED ORDER — ONDANSETRON HCL 4 MG PO TABS: 4.0000 mg | ORAL_TABLET | Freq: Three times a day (TID) | ORAL | 0 refills | Status: AC | PRN

## 2023-04-24 NOTE — Patient Instructions (Signed)
ESWL for Kidney Stones  Extracorporeal shock wave lithotripsy (ESWL) is a treatment that can help break up kidney stones that are too large to pass on their own.  This is a nonsurgical procedure that breaks up a kidney stone with shock waves. These shock waves pass through your body and focus on the kidney stone. They cause the kidney stone to break into smaller pieces (fragments) while it is still in the urinary tract. The fragments of stone can pass more easily out of your body in the urine. Tell a health care provider about: Any allergies you have. All medicines you are taking, including vitamins, herbs, eye drops, creams, and over-the-counter medicines. Any problems you or family members have had with anesthetic medicines. Any bleeding problems you have. Any surgeries you have had. Any medical conditions you have. Whether you are pregnant or may be pregnant. What are the risks? Your health care provider will talk with you about risks. These may include: Infection. Bleeding from the kidney. Bruising of the kidney or skin. Scarring of the kidney. This can lead to: Increased blood pressure. Poor kidney function. Return (recurrence) of kidney stones. Damage to other structures or organs. This may include the liver, colon, spleen, or pancreas. Blockage (obstruction) of the tube that carries urine from the kidney to the bladder (ureter). Failure of the kidney stone to break into fragments. What happens before the procedure? When to stop eating and drinking Follow instructions from your health care provider about what you may eat and drink. These may include: 8 hours before your procedure Stop eating most foods. Do not eat meat, fried foods, or fatty foods. Eat only light foods, such as toast or crackers. All liquids are okay except energy drinks and alcohol. 6 hours before your procedure Stop eating. Drink only clear liquids, such as water, clear fruit juice, black coffee, plain tea,  and sports drinks. Do not drink energy drinks or alcohol. 2 hours before your procedure Stop drinking all liquids. You may be allowed to take medicines with small sips of water. If you do not follow your health care provider's instructions, your procedure may be delayed or canceled. Medicines Ask your health care provider about: Changing or stopping your regular medicines. These include any diabetes medicines or blood thinners you take. Taking medicines such as aspirin and ibuprofen. These medicines can thin your blood. Do not take them unless your health care provider tells you to. Taking over-the-counter medicines, vitamins, herbs, and supplements. Tests You may have tests, such as: Blood tests. Urine tests. Imaging tests. This may include a CT scan. Surgery safety Ask your health care provider: How your surgery site will be marked. What steps will be taken to help prevent infection. These steps may include: Washing skin with a soap that kills germs. Receiving antibiotics. General instructions If you will be going home right after the procedure, plan to have a responsible adult: Take you home from the hospital or clinic. You will not be allowed to drive. Care for you for the time you are told. What happens during the procedure?  An IV will be inserted into one of your veins. You may be given: A sedative. This helps you relax. Anesthesia. This will: Numb certain areas of your body. Make you fall asleep for surgery. A water-filled cushion may be placed behind your kidney or on your abdomen. In some cases, you may be placed in a tub of lukewarm water. Your body will be positioned in a way that makes it  easier to target the kidney stone. An X-ray or ultrasound exam will be done to locate your stone. Shock waves will be aimed at the stone. If you are awake, you may feel a tapping sensation as the shock waves pass through your body. A small mesh tube (stent) may be placed in your  ureter. This will help keep urine flowing from the kidney if the fragments of the stone have been blocking the ureter. The stent will be removed at a later time by your health care provider. The procedure may vary among health care providers and hospitals. What happens after the procedure? Your blood pressure, heart rate, breathing rate, and blood oxygen level will be monitored until you leave the hospital or clinic. You may have an X-ray after the procedure to see how many of the kidney stones were broken up. This will also show how much of the stone has passed. If there are still large fragments after treatment, you may need to have a second procedure at a later time. This information is not intended to replace advice given to you by your health care provider. Make sure you discuss any questions you have with your health care provider. Document Revised: 03/16/2022 Document Reviewed: 03/16/2022 Elsevier Patient Education  2024 [employer].

## 2023-04-24 NOTE — Interval H&P Note (Signed)
History and Physical Interval Note:  04/24/2023 12:14 PM  Tom Humphrey  has presented today for surgery, with the diagnosis of left ureteral calculus.  The various methods of treatment have been discussed with the patient and family. After consideration of risks, benefits and other options for treatment, the patient has consented to  Procedure(s) with comments: EXTRACORPOREAL SHOCK WAVE LITHOTRIPSY (ESWL) (Left) - told office to tell pt to arrive at 12:30 and be NPO after midnight since case was added late as a surgical intervention.  The patient's history has been reviewed, patient examined, no change in status, stable for surgery.  I have reviewed the patient's chart and labs.  Questions were answered to the patient's satisfaction.     Wilkie Aye

## 2023-04-25 ENCOUNTER — Ambulatory Visit
Admission: RE | Admit: 2023-04-25 | Discharge: 2023-04-25 | Disposition: A | Discharge status: Home / Self Care | Payer: Medicaid Other | Source: Ambulatory Visit | Attending: Otolaryngology | Admitting: Otolaryngology

## 2023-04-25 ENCOUNTER — Encounter (HOSPITAL_COMMUNITY): Payer: Self-pay | Admitting: Urology

## 2023-04-25 DIAGNOSIS — J387 Other diseases of larynx: Secondary | ICD-10-CM

## 2023-04-25 MED ORDER — IOPAMIDOL (ISOVUE-300) INJECTION 61%
75.0000 mL | Freq: Once | INTRAVENOUS | Status: AC | PRN
  Administered 2023-04-25: 75 mL via INTRAVENOUS
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
## 2023-05-15 ENCOUNTER — Encounter: Payer: Self-pay | Admitting: Urology

## 2023-05-15 ENCOUNTER — Ambulatory Visit (HOSPITAL_COMMUNITY)
Admission: RE | Admit: 2023-05-15 | Discharge: 2023-05-15 | Disposition: A | Discharge status: Home / Self Care | Payer: Medicaid Other | Source: Ambulatory Visit | Attending: Urology | Admitting: Urology

## 2023-05-15 ENCOUNTER — Ambulatory Visit (INDEPENDENT_AMBULATORY_CARE_PROVIDER_SITE_OTHER): Payer: Medicaid Other | Admitting: Urology

## 2023-05-15 VITALS — BP 117/75 | HR 61 | Temp 98.0°F

## 2023-05-15 DIAGNOSIS — N200 Calculus of kidney: Secondary | ICD-10-CM | POA: Diagnosis present

## 2023-05-15 DIAGNOSIS — Z87442 Personal history of urinary calculi: Secondary | ICD-10-CM

## 2023-05-15 DIAGNOSIS — Z09 Encounter for follow-up examination after completed treatment for conditions other than malignant neoplasm: Secondary | ICD-10-CM

## 2023-05-15 NOTE — Patient Instructions (Signed)

## 2023-06-07 DIAGNOSIS — N200 Calculus of kidney: Secondary | ICD-10-CM | POA: Insufficient documentation

## 2023-08-15 ENCOUNTER — Other Ambulatory Visit (INDEPENDENT_AMBULATORY_CARE_PROVIDER_SITE_OTHER): Payer: Self-pay | Admitting: Urology
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
## 2023-11-13 ENCOUNTER — Ambulatory Visit (HOSPITAL_COMMUNITY)
Admission: RE | Admit: 2023-11-13 | Discharge: 2023-11-13 | Disposition: A | Discharge status: Home / Self Care | Payer: Medicaid Other | Source: Ambulatory Visit | Attending: Urology | Admitting: Urology

## 2023-11-13 ENCOUNTER — Ambulatory Visit: Payer: Medicaid Other | Admitting: Urology

## 2023-11-13 ENCOUNTER — Encounter: Payer: Self-pay | Admitting: Urology

## 2023-11-13 ENCOUNTER — Telehealth: Payer: Self-pay

## 2023-11-13 VITALS — BP 133/83 | HR 84 | Temp 97.7°F

## 2023-11-13 DIAGNOSIS — Z09 Encounter for follow-up examination after completed treatment for conditions other than malignant neoplasm: Secondary | ICD-10-CM

## 2023-11-13 DIAGNOSIS — Z87442 Personal history of urinary calculi: Secondary | ICD-10-CM

## 2023-11-13 DIAGNOSIS — N200 Calculus of kidney: Secondary | ICD-10-CM | POA: Insufficient documentation

## 2023-11-13 LAB — BLADDER SCAN AMB NON-IMAGING: Scan Result: 48

## 2023-11-13 NOTE — Telephone Encounter (Signed)
Tried calling patient with no answer, left vm reminding patient to get KUB.

## 2023-11-13 NOTE — Addendum Note (Signed)
Addended by: Christoper Fabian R on: 11/13/2023 02:25 PM   Modules accepted: Orders

## 2023-11-26 NOTE — Telephone Encounter (Signed)
-----   Message from Donnita Falls sent at 11/26/2023  8:31 AM EST ----- Please let pt know KUB was normal - no stones visualized. Thanks.

## 2023-11-26 NOTE — Telephone Encounter (Signed)
Patient made aware and voiced understanding.

## 2024-11-13 ENCOUNTER — Ambulatory Visit: Payer: Medicaid Other | Admitting: Urology

## 2024-11-14 ENCOUNTER — Other Ambulatory Visit: Payer: Self-pay

## 2024-11-14 ENCOUNTER — Ambulatory Visit (HOSPITAL_COMMUNITY)
Admission: RE | Admit: 2024-11-14 | Discharge: 2024-11-14 | Disposition: A | Discharge status: Home / Self Care | Source: Ambulatory Visit | Attending: Allergy & Immunology | Admitting: Allergy & Immunology

## 2024-11-14 ENCOUNTER — Ambulatory Visit (INDEPENDENT_AMBULATORY_CARE_PROVIDER_SITE_OTHER): Admitting: Allergy & Immunology

## 2024-11-14 ENCOUNTER — Encounter: Payer: Self-pay | Admitting: Allergy & Immunology

## 2024-11-14 VITALS — BP 114/76 | HR 74 | Temp 98.3°F | Ht 74.0 in | Wt 275.0 lb

## 2024-11-14 DIAGNOSIS — R058 Other specified cough: Secondary | ICD-10-CM

## 2024-11-14 DIAGNOSIS — R234 Changes in skin texture: Secondary | ICD-10-CM

## 2024-11-14 DIAGNOSIS — J310 Chronic rhinitis: Secondary | ICD-10-CM | POA: Diagnosis not present

## 2024-11-14 DIAGNOSIS — J4540 Moderate persistent asthma, uncomplicated: Secondary | ICD-10-CM | POA: Diagnosis not present

## 2024-11-14 MED ORDER — BUDESONIDE-FORMOTEROL FUMARATE 160-4.5 MCG/ACT IN AERO: 2.0000 | INHALATION_SPRAY | Freq: Two times a day (BID) | RESPIRATORY_TRACT | 5 refills | Status: DC

## 2024-11-14 MED ORDER — PREDNISONE 10 MG PO TABS: ORAL_TABLET | ORAL | 0 refills | Status: AC
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
## 2024-11-14 NOTE — Patient Instructions (Addendum)
 1. Productive cough - We are going to get a chest X-ray to see if there is evidence of a pneumonia. - We may consider getting a chest CT.  - Start doxycycline 100mg  TWICE DAILY for two weeks. - We are going to get some labs to rule out weird causes of coughing and shortness of breath. - Alpha-1-antitrypsin - ANCA TITERS - Aspergillus Precipitins - IgE - CBC with Differential/Platelet - Hypersensitivity Pneumonitis  2. Peeling skin - I am not sure what is going on with your hands, but maybe the skin testing will help figure this out. - Add on triamcinolone ointment twice daily as needed for the peeling skin on the hands.   3. Chronic rhinitis - Because of insurance stipulations, we cannot do skin testing on the same day as your first visit. - We are all working to fight this, but for now we need to do two separate visits.  - We will know more after we do testing at the next visit.  - The skin testing visit can be squeezed in at your convenience.  - Then we can make a more full plan to address all of your symptoms. - Be sure to stop your antihistamines for 3 days before this appointment.   4. Moderate persistent asthma with acute exacerbation - possible pneumonia/bronchitis - Lung testing was in the 44% range and did improve following the nebulizer treatment. - We are going to start you (likely temporarily) on a daily controller medication to keep the inflammation on your lungs under better control. - Start Symbicort two puffs twice daily via spacer (this contains a long acting albuterol  combined with an inhaled steroid). - We are also starting you on a prednisone  taper AND an antibiotic. - Spacer sample and demonstration provided. - Daily controller medication(s): Symbicort 80/4.39mcg two puffs twice daily with spacer - Prior to physical activity: albuterol  2 puffs 10-15 minutes before physical activity. - Rescue medications: albuterol  4 puffs every 4-6 hours as needed and albuterol   nebulizer one vial every 4-6 hours as needed - Asthma control goals:  * Full participation in all desired activities (may need albuterol  before activity) * Albuterol  use two time or less a week on average (not counting use with activity) * Cough interfering with sleep two time or less a month * Oral steroids no more than once a year * No hospitalizations  5. Return in about 2 weeks (around 11/28/2024). You can have the follow up appointment with Dr. Iva or a Nurse Practicioner (our Nurse Practitioners are excellent and always have Physician oversight!).    Please inform us  of any Emergency Department visits, hospitalizations, or changes in symptoms. Call us  before going to the ED for breathing or allergy symptoms since we might be able to fit you in for a sick visit. Feel free to contact us  anytime with any questions, problems, or concerns.  It was a pleasure to meet you today!  Websites that have reliable patient information: 1. American Academy of Asthma, Allergy, and Immunology: www.aaaai.org 2. Food Allergy Research and Education (FARE): foodallergy.org 3. Mothers of Asthmatics: http://www.asthmacommunitynetwork.org 4. American College of Allergy, Asthma, and Immunology: www.acaai.org      Like us  on [employer] and Instagram for our latest updates!      A healthy democracy works best when [employer] participate! Make sure you are registered to vote! If you have moved or changed any of your contact information, you will need to get this updated before voting! Scan the QR codes  below to learn more!

## 2024-11-17 ENCOUNTER — Encounter: Payer: Self-pay | Admitting: Allergy & Immunology
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
## 2024-11-18 ENCOUNTER — Ambulatory Visit: Admitting: Urology

## 2024-11-18 DIAGNOSIS — N200 Calculus of kidney: Secondary | ICD-10-CM

## 2024-12-03 ENCOUNTER — Ambulatory Visit: Payer: Self-pay | Admitting: Allergy & Immunology

## 2024-12-10 ENCOUNTER — Encounter: Payer: Self-pay | Admitting: Allergy & Immunology

## 2024-12-10 ENCOUNTER — Other Ambulatory Visit: Payer: Self-pay

## 2024-12-10 ENCOUNTER — Ambulatory Visit: Admitting: Allergy & Immunology

## 2024-12-10 ENCOUNTER — Ambulatory Visit: Admitting: Family Medicine

## 2024-12-10 VITALS — BP 120/80 | HR 105 | Temp 98.1°F

## 2024-12-10 DIAGNOSIS — R058 Other specified cough: Secondary | ICD-10-CM | POA: Diagnosis not present

## 2024-12-10 DIAGNOSIS — J4540 Moderate persistent asthma, uncomplicated: Secondary | ICD-10-CM

## 2024-12-10 DIAGNOSIS — J310 Chronic rhinitis: Secondary | ICD-10-CM | POA: Diagnosis not present

## 2024-12-10 DIAGNOSIS — Z77090 Contact with and (suspected) exposure to asbestos: Secondary | ICD-10-CM | POA: Diagnosis not present

## 2024-12-10 DIAGNOSIS — R234 Changes in skin texture: Secondary | ICD-10-CM | POA: Diagnosis not present

## 2024-12-10 MED ORDER — BENZONATATE 100 MG PO CAPS: 200.0000 mg | ORAL_CAPSULE | Freq: Three times a day (TID) | ORAL | 5 refills | Status: AC | PRN

## 2024-12-10 MED ORDER — CETIRIZINE HCL 10 MG PO TABS: 10.0000 mg | ORAL_TABLET | Freq: Two times a day (BID) | ORAL | 1 refills | Status: AC

## 2024-12-10 MED ORDER — BUDESONIDE-FORMOTEROL FUMARATE 160-4.5 MCG/ACT IN AERO: 2.0000 | INHALATION_SPRAY | Freq: Two times a day (BID) | RESPIRATORY_TRACT | 5 refills | Status: AC

## 2024-12-10 NOTE — Progress Notes (Unsigned)
" ° °  [address] [place] [address] Dept: [phone]  FOLLOW UP NOTE  Patient ID: Tom Humphrey Reasoner, male    DOB: 13-Nov-1981  Age: 44 y.o. MRN: [id number] Date of Office Visit: 12/10/2024  Assessment  Chief Complaint: No chief complaint on file.  HPI Tom VOLDEN is a 44 year old male who presents to the clinic for follow-up visit.  He was last seen in this clinic on 11/14/2024 by Dr. Iva for evaluation of chronic rhinitis, cough, asthma, and peeling of his skin. Absolute eosinophil count 300 via lab on 12/08/2024  CLINICAL DATA:  Productive cough   EXAM: CHEST - 2 VIEW   COMPARISON:  11/30/2018   FINDINGS: The heart size and mediastinal contours are within normal limits. Both lungs are clear. The visualized skeletal structures are unremarkable.   IMPRESSION: No active cardiopulmonary disease.     Electronically Signed   By: Luke Bun M.D.   On: 11/30/2024 00:21    Discussed the use of AI scribe software for clinical note transcription with the patient, who gave verbal consent to proceed.  History of Present Illness      Drug Allergies:  Allergies[1]  Physical Exam: There were no vitals taken for this visit.   Physical Exam  Diagnostics:    Assessment and Plan: No diagnosis found.  No orders of the defined types were placed in this encounter.   There are no Patient Instructions on file for this visit.  No follow-ups on file.    Thank you for the opportunity to care for this patient.  Please do not hesitate to contact me with questions.  Arlean Mutter, FNP [facility]          [1]  Allergies Allergen Reactions   Hydrocodone  Hives   Tramadol Hives and Itching   Codeine Itching, Nausea And Vomiting and Rash   "

## 2024-12-10 NOTE — Progress Notes (Signed)
 "  FOLLOW UP  Date of Service/Encounter:  12/10/2024   Assessment:   Chronic rhinitis - planning for skin testing at the next visit   Productive cough   Peeling skin   Moderate persistent asthma, uncomplicated  Plan/Recommendations:   1. Productive cough - Chest X-ray was normal at the last visit. - We ordered a chest CT to look at your lungs more closely (this would look for evidence of asbestosis). - We are ordering imaging for you at a [facility] facility. - We will check to see if a PA is needed, but go ahead and call to schedule the imaging study. - The phone number for [facility] Scheduling is [phone] (option #3).  - We are waiting for some labs to come back.   2. Peeling skin - This seems to be improved.  - Continue with the triamcinolone ointment twice daily as needed for the peeling skin on the hands.   3. Chronic rhinitis - We still need to do the allergy testing at some point in the future. - Add on Zyrtec  (cetirizine ) 10mg  twice daily. - Continue with the Singulair (montelukast) 10mg  daily. - Continue with the Flonase  (fluticasone ) one spray per nostril daily.   4. Moderate persistent asthma with acute exacerbation - possible pneumonia/bronchitis - Lung testing was much better today.  - You did have elevated eosinophils, so we could start something called Fasenra to help control your breathing.   - Consent signed today. - Tammy will reach out to discuss the approval  - Daily controller medication(s): Symbicort  80/4.65mcg two puffs twice daily with spacer and Singulair (montelukast) at night - Prior to physical activity: albuterol  2 puffs 10-15 minutes before physical activity. - Rescue medications: albuterol  4 puffs every 4-6 hours as needed and albuterol  nebulizer one vial every 4-6 hours as needed - Asthma control goals:  * Full participation in all desired activities (may need albuterol  before activity) * Albuterol  use two time or less a week on average  (not counting use with activity) * Cough interfering with sleep two time or less a month * Oral steroids no more than once a year * No hospitalizations  5. Return in about 1 week (around 12/17/2024) for ALLERGY TESTING (1-55). You can have the follow up appointment with Dr. Iva or a Nurse Practicioner (our Nurse Practitioners are excellent and always have Physician oversight!).   Subjective:   Tom Humphrey is a 44 y.o. male presenting today for follow up of  Chief Complaint  Patient presents with   Follow-up   Cough   Nasal Congestion    With sinus drainage  States that feels like something is in his throat.    Tom Humphrey has a history of the following: Patient Active Problem List   Diagnosis Date Noted   Nephrolithiasis 06/07/2023   Cigarette smoker 02/01/2019   Allergic rhinitis 03/06/2014   History of cigar smoking 03/06/2014   GERD (gastroesophageal reflux disease) 03/06/2014    History obtained from: chart review and patient.  Discussed the use of AI scribe software for clinical note transcription with the patient and/or guardian, who gave verbal consent to proceed.  Tom Humphrey is a 44 y.o. male presenting for a follow up visit.  He was last seen December 2025 patient.  At that time, we had a chest x-ray.  We started doxycycline 100 g twice daily for 2 weeks and we obtained some labs.  He did have peeling skin so yeah triamcinolone ointment twice daily as needed.  For his rhinitis, we decided to do environmental allergy testing.  His lung testing was very for the 44% range.  We started him on Symbicort  80 mcg 2 puffs twice daily.  We also started a taper.  He was supposed to follow-up in 2 weeks for testing.  We did labs to look for weird causes of asthma.  Many of these are still pending, but the ones that have returned are normal.  Absolute eosinophil count was 300.  Chest x-ray was clear.  Since last visit, he has been around the same, but he is making some  improvements.   Asthma/Respiratory Symptom History: He has been experiencing persistent coughing and mucus production since an illness over the holidays. These symptoms have not fully resolved with antibiotics and steroids. He uses Symbicort , two puffs twice a day, which provides some relief, and a nebulizer with albuterol  as needed, approximately twice a week. He also takes montelukast every night for allergies and uses Flonase  nasal spray. Previously, he found Zyrtec  effective for his allergies. CXR waas normal at the last visit - he is feeling better, but he thinks he might need some more prednisone .   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   Labs were all normal  did labs to look for difficult to control causes of asthma. AEC was 300.  Allergic Rhinitis Symptom History: He does have some PND. He is not currently on antihistamines. He remains on the montelukast and the fluticasone . He is open to trying another antihistamine. We haven ot done any testing since his lung testing has been so low.   Skin Symptom History: He reports peeling skin on his hands, which has improved but occasionally remains itchy. He uses triamcinolone as needed for this condition. No known food allergies.  Otherwise, there have been no changes to his past medical history, surgical history, family history, or social history.    Review of systems otherwise negative other than that mentioned in the HPI.    Objective:   Blood pressure 120/80, pulse (!) 105, temperature 98.1 F (36.7 C), SpO2 96%. There is no height or weight on file to calculate BMI.    Physical Exam Vitals reviewed.  Constitutional:      Appearance: He is well-developed.  HENT:     Head: Normocephalic and atraumatic.     Right Ear: Tympanic membrane, ear canal and external ear normal. No drainage, swelling or tenderness. Tympanic membrane is not injected, scarred, erythematous, retracted or  bulging.     Left Ear: Tympanic membrane, ear canal and external ear normal. No drainage, swelling or tenderness. Tympanic membrane is not injected, scarred, erythematous, retracted or bulging.     Nose: No nasal deformity, septal deviation, mucosal edema or rhinorrhea.     Right Turbinates: Enlarged, swollen and pale.     Left Turbinates: Enlarged, swollen and pale.     Right Sinus: No maxillary sinus tenderness or frontal sinus tenderness.     Left Sinus: No maxillary sinus tenderness or frontal sinus tenderness.     Comments: No polpys noted.     Mouth/Throat:     Lips: Pink.     Mouth: Mucous membranes are moist. Mucous membranes are not pale and not dry.     Pharynx: Uvula midline.  Eyes:     General: Lids are normal. Allergic shiner present.        Right eye: No discharge.  Left eye: No discharge.     Conjunctiva/sclera: Conjunctivae normal.     Right eye: Right conjunctiva is not injected. No chemosis.    Left eye: Left conjunctiva is not injected. No chemosis.    Pupils: Pupils are equal, round, and reactive to light.  Cardiovascular:     Rate and Rhythm: Normal rate and regular rhythm.     Heart sounds: Normal heart sounds.  Pulmonary:     Effort: Pulmonary effort is normal. No tachypnea, accessory muscle usage or respiratory distress.     Breath sounds: Decreased air movement present. Examination of the right-lower field reveals decreased breath sounds. Examination of the left-lower field reveals decreased breath sounds. Decreased breath sounds present. No wheezing, rhonchi or rales.  Chest:     Chest wall: No tenderness.  Abdominal:     Tenderness: There is no abdominal tenderness. There is no guarding or rebound.  Lymphadenopathy:     Head:     Right side of head: No submandibular, tonsillar or occipital adenopathy.     Left side of head: No submandibular, tonsillar or occipital adenopathy.     Cervical: No cervical adenopathy.  Skin:    General: Skin is warm.      Capillary Refill: Capillary refill takes less than 2 seconds.     Coloration: Skin is not pale.     Findings: No abrasion, erythema, petechiae or rash. Rash is not papular, urticarial or vesicular.     Comments: No eczematous or urticarial lesions noted.   Neurological:     Mental Status: He is alert.  Psychiatric:        Behavior: Behavior is cooperative.      Diagnostic studies:   Spirometry:        Allergy Studies: none       Marty Shaggy, MD  [facility]        "

## 2024-12-10 NOTE — Patient Instructions (Incomplete)
 Asthma Continue Symbicort  160-2 puffs twice a day with a spacer to prevent cough or wheeze Continue albuterol  2 puffs once every 4 hours if needed for cough or wheeze You may use albuterol  2 puffs 5-15 minutes before activity to decrease cough or wheeze   Chronic rhinitis  Inflammatory dermatosis  Call the clinic if this treatment plan is not working well for you.    Follow up in *** or sooner if needed.

## 2024-12-10 NOTE — Patient Instructions (Addendum)
 1. Productive cough - Chest X-ray was normal at the last visit. - We ordered a chest CT to look at your lungs more closely (this would look for evidence of asbestosis). - We are ordering imaging for you at a [facility] facility. - We will check to see if a PA is needed, but go ahead and call to schedule the imaging study. - The phone number for [facility] Scheduling is [phone] (option #3).  - We are waiting for some labs to come back.   2. Peeling skin - This seems to be improved.  - Continue with the triamcinolone ointment twice daily as needed for the peeling skin on the hands.   3. Chronic rhinitis - We still need to do the allergy testing at some point in the future. - Add on Zyrtec  (cetirizine ) 10mg  twice daily. - Continue with the Singulair (montelukast) 10mg  daily. - Continue with the Flonase  (fluticasone ) one spray per nostril daily.   4. Moderate persistent asthma with acute exacerbation - possible pneumonia/bronchitis - Lung testing was much better today.  - You did have elevated eosinophils, so we could start something called Fasenra to help control your breathing.   - Consent signed today. - Tammy will reach out to discuss the approval  - Daily controller medication(s): Symbicort  80/4.85mcg two puffs twice daily with spacer and Singulair (montelukast) at night - Prior to physical activity: albuterol  2 puffs 10-15 minutes before physical activity. - Rescue medications: albuterol  4 puffs every 4-6 hours as needed and albuterol  nebulizer one vial every 4-6 hours as needed - Asthma control goals:  * Full participation in all desired activities (may need albuterol  before activity) * Albuterol  use two time or less a week on average (not counting use with activity) * Cough interfering with sleep two time or less a month * Oral steroids no more than once a year * No hospitalizations  5. Return in about 1 week (around 12/17/2024) for ALLERGY TESTING (1-55). You can have the follow  up appointment with Dr. Iva or a Nurse Practicioner (our Nurse Practitioners are excellent and always have Physician oversight!).    Please inform us  of any Emergency Department visits, hospitalizations, or changes in symptoms. Call us  before going to the ED for breathing or allergy symptoms since we might be able to fit you in for a sick visit. Feel free to contact us  anytime with any questions, problems, or concerns.  It was a pleasure to meet you today!  Websites that have reliable patient information: 1. American Academy of Asthma, Allergy, and Immunology: www.aaaai.org 2. Food Allergy Research and Education (FARE): foodallergy.org 3. Mothers of Asthmatics: http://www.asthmacommunitynetwork.org 4. American College of Allergy, Asthma, and Immunology: www.acaai.org      Like us  on [employer] and Instagram for our latest updates!      A healthy democracy works best when [employer] participate! Make sure you are registered to vote! If you have moved or changed any of your contact information, you will need to get this updated before voting! Scan the QR codes below to learn more!

## 2024-12-12 LAB — CBC WITH DIFFERENTIAL/PLATELET
Basophils Absolute: 0.1 x10E3/uL (ref 0.0–0.2)
Basos: 1 %
EOS (ABSOLUTE): 0.3 x10E3/uL (ref 0.0–0.4)
Eos: 3 %
Hematocrit: 46.6 % (ref 37.5–51.0)
Hemoglobin: 15.5 g/dL (ref 13.0–17.7)
Immature Grans (Abs): 0.1 x10E3/uL (ref 0.0–0.1)
Immature Granulocytes: 1 %
Lymphocytes Absolute: 2.8 x10E3/uL (ref 0.7–3.1)
Lymphs: 30 %
MCH: 29.1 pg (ref 26.6–33.0)
MCHC: 33.3 g/dL (ref 31.5–35.7)
MCV: 88 fL (ref 79–97)
Monocytes Absolute: 0.6 x10E3/uL (ref 0.1–0.9)
Monocytes: 6 %
Neutrophils Absolute: 5.7 x10E3/uL (ref 1.4–7.0)
Neutrophils: 59 %
Platelets: 320 x10E3/uL (ref 150–450)
RBC: 5.32 x10E6/uL (ref 4.14–5.80)
RDW: 12.8 % (ref 11.6–15.4)
WBC: 9.4 x10E3/uL (ref 3.4–10.8)

## 2024-12-12 LAB — HYPERSENSITIVITY PNEUMONITIS

## 2024-12-12 LAB — IGE: IgE (Immunoglobulin E), Serum: 19 [IU]/mL (ref 6–495)

## 2024-12-12 LAB — ANCA TITERS
Atypical pANCA: <1:20 {titer}
C-ANCA: <1:20 {titer}
P-ANCA: <1:20 {titer}

## 2024-12-12 LAB — ASPERGILLUS PRECIPITINS

## 2024-12-12 LAB — ALPHA-1-ANTITRYPSIN: A-1 Antitrypsin: 144 mg/dL (ref 101–187)

## 2024-12-16 ENCOUNTER — Encounter: Payer: Self-pay | Admitting: Allergy & Immunology

## 2024-12-17 ENCOUNTER — Ambulatory Visit: Admitting: Allergy & Immunology

## 2024-12-18 ENCOUNTER — Telehealth: Payer: Self-pay | Admitting: *Deleted

## 2024-12-18 NOTE — Telephone Encounter (Signed)
 I called and spoke with the insurance company through [employer] and initiated a Pre Certification for the CT Chest High Resolution. After answering questions and initiating that Pre Certification they stated that the information provided does not qualify the patient for the CT scan. The provider will need to call tomorrow to do a peer to peer review at [phone], reference number is [id number].

## 2024-12-18 NOTE — Telephone Encounter (Signed)
-----   Message from Marty Shaggy, MD sent at 12/16/2024  7:38 AM EST ----- High resolution chest CT ordered.
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
## 2024-12-23 ENCOUNTER — Ambulatory Visit: Admitting: Urology

## 2024-12-23 DIAGNOSIS — N200 Calculus of kidney: Secondary | ICD-10-CM

## 2024-12-23 NOTE — Telephone Encounter (Signed)
 I will check tomorrow and see how he is doing when he comes for his skin testing appointment.

## 2024-12-24 ENCOUNTER — Ambulatory Visit (INDEPENDENT_AMBULATORY_CARE_PROVIDER_SITE_OTHER): Admitting: Allergy & Immunology

## 2024-12-24 ENCOUNTER — Encounter: Payer: Self-pay | Admitting: Allergy & Immunology

## 2024-12-24 DIAGNOSIS — J4540 Moderate persistent asthma, uncomplicated: Secondary | ICD-10-CM

## 2024-12-24 DIAGNOSIS — J3089 Other allergic rhinitis: Secondary | ICD-10-CM

## 2024-12-24 NOTE — Patient Instructions (Addendum)
 1. Productive cough - Chest X-ray was normal at the last visit. - We tried to get a chest CT done, but insurance denied it (we can try a peer-to-peer, but I think we can hold off right now).   2. Peeling skin - This seems to be improved.  - Continue with the triamcinolone ointment twice daily as needed for the peeling skin on the hands.   3. Chronic rhinitis - Testing was positive to dust mite and dog. - Avoidance measures provided.  - STRONGLY consider allergy shots for long-term control (we can mix your allergies into ONE vial, therefore only one injection).  - Consider rush immunotherapy to reach maintenance more quickly (we have an opening on FRI FEB 6th or we could do FEB 13th or FEB 20th). - Continue with Zyrtec  (cetirizine ) 10mg  twice daily. - Continue with the Singulair (montelukast) 10mg  daily. - Continue with the Flonase  (fluticasone ) one spray per nostril daily.   4. Moderate persistent asthma, uncomplicated  - Lung testing not done today.  - We are working on getting you started on Fasenra (consent signed at the last visit) - Daily controller medication(s): Symbicort  80/4.72mcg two puffs twice daily with spacer and Singulair (montelukast) at night - Prior to physical activity: albuterol  2 puffs 10-15 minutes before physical activity. - Rescue medications: albuterol  4 puffs every 4-6 hours as needed and albuterol  nebulizer one vial every 4-6 hours as needed - Asthma control goals:  * Full participation in all desired activities (may need albuterol  before activity) * Albuterol  use two time or less a week on average (not counting use with activity) * Cough interfering with sleep two time or less a month * Oral steroids no more than once a year * No hospitalizations  5. Return in about 3 months (around 03/24/2025). You can have the follow up appointment with Dr. Iva or a Nurse Practicioner (our Nurse Practitioners are excellent and always have Physician oversight!).     Please inform us  of any Emergency Department visits, hospitalizations, or changes in symptoms. Call us  before going to the ED for breathing or allergy symptoms since we might be able to fit you in for a sick visit. Feel free to contact us  anytime with any questions, problems, or concerns.  It was a pleasure to see you again today!  Websites that have reliable patient information: 1. American Academy of Asthma, Allergy, and Immunology: www.aaaai.org 2. Food Allergy Research and Education (FARE): foodallergy.org 3. Mothers of Asthmatics: http://www.asthmacommunitynetwork.org 4. American College of Allergy, Asthma, and Immunology: www.acaai.org      Like us  on [employer] and Instagram for our latest updates!      A healthy democracy works best when [employer] participate! Make sure you are registered to vote! If you have moved or changed any of your contact information, you will need to get this updated before voting! Scan the QR codes below to learn more!      Airborne Adult Perc - 12/24/24 1441     Time Antigen Placed 1441    Allergen Manufacturer Jestine    Location Back    Number of Test 55    1. Control-Buffer 50% Glycerol Negative    2. Control-Histamine 2+    3. Bahia Negative    4. Bermuda Negative    5. Johnson Negative    6. [place]  Blue Negative    7. Meadow Fescue Negative    8. Perennial Rye Negative    9. Timothy Negative    10. Ragweed Mix  Negative    11. Cocklebur Negative    12. Plantain,  English Negative    13. Baccharis Negative    14. Dog Fennel Negative    15. Russian Thistle Negative    16. Lamb's Quarters Negative    17. Sheep Sorrell Negative    18. Rough Pigweed Negative    19. Marsh Elder, Rough Negative    20. Mugwort, Common Negative    21. Box, Elder Negative    22. Cedar, red Negative    23. Sweet Gum Negative    24. Pecan Pollen Negative    25. Pine Mix Negative    26. Walnut, Black Pollen Negative    27. Red Mulberry Negative     28. Ash Mix Negative    29. Birch Mix Negative    30. Beech American Negative    31. Cottonwood, Eastern Negative    32. Hickory, White Negative    33. Maple Mix Negative    34. Oak, Eastern Mix Negative    35. Sycamore Eastern Negative    36. Alternaria Alternata Negative    37. Cladosporium Herbarum Negative    38. Aspergillus Mix Negative    39. Penicillium Mix Negative    40. Bipolaris Sorokiniana (Helminthosporium) Negative    41. Drechslera Spicifera (Curvularia) Negative    42. Mucor Plumbeus Negative    43. Fusarium Moniliforme Negative    44. Aureobasidium Pullulans (pullulara) Negative    45. Rhizopus Oryzae Negative    46. Botrytis Cinera Negative    47. Epicoccum Nigrum Negative    48. Phoma Betae Negative    49. Dust Mite Mix Negative    50. Cat Hair 10,000 BAU/ml Negative    51.  Dog Epithelia Negative    52. Mixed Feathers Negative    53. Horse Epithelia Negative    54. Cockroach, German Negative    55. Tobacco Leaf Negative          Intradermal - 12/24/24 1514     Time Antigen Placed 1514    Allergen Manufacturer Jestine    Location Arm    Number of Test 16    Control Negative    Bahia Negative    Bermuda Negative    Johnson Negative    7 Grass Negative    Ragweed Mix Negative    Weed Mix Negative    Tree Mix Negative    Mold 1 Negative    Mold 2 Negative    Mold 3 Negative    Mold 4 Negative    Mite Mix 3+    Cat Negative    Dog 3+    Cockroach Negative    Other Omitted          Control of Dog or Cat Allergen  Avoidance is the best way to manage a dog or cat allergy. If you have a dog or cat and are allergic to dog or cats, consider removing the dog or cat from the home. If you have a dog or cat but dont want to find it a new home, or if your family wants a pet even though someone in the household is allergic, here are some strategies that may help keep symptoms at bay:  Keep the pet out of your bedroom and restrict it to only a few  rooms. Be advised that keeping the dog or cat in only one room will not limit the allergens to that room. Dont pet, hug or kiss the dog or cat; if you  do, wash your hands with soap and water . High-efficiency particulate air (HEPA) cleaners run continuously in a bedroom or living room can reduce allergen levels over time. Regular use of a high-efficiency vacuum cleaner or a central vacuum can reduce allergen levels. Giving your dog or cat a bath at least once a week can reduce airborne allergen.  Control of Dust Mite Allergen    Dust mites play a major role in allergic asthma and rhinitis.  They occur in environments with high humidity wherever human skin is found.  Dust mites absorb humidity from the atmosphere (ie, they do not drink) and feed on organic matter (including shed human and animal skin).  Dust mites are a microscopic type of insect that you cannot see with the naked eye.  High levels of dust mites have been detected from mattresses, pillows, carpets, upholstered furniture, bed covers, clothes, soft toys and any woven material.  The principal allergen of the dust mite is found in its feces.  A gram of dust may contain 1,000 mites and 250,000 fecal particles.  Mite antigen is easily measured in the air during house cleaning activities.  Dust mites do not bite and do not cause harm to humans, other than by triggering allergies/asthma.    Ways to decrease your exposure to dust mites in your home:  Encase mattresses, box springs and pillows with a mite-impermeable barrier or cover   Wash sheets, blankets and drapes weekly in hot water  (130 F) with detergent and dry them in a dryer on the hot setting.  Have the room cleaned frequently with a vacuum cleaner and a damp dust-mop.  For carpeting or rugs, vacuuming with a vacuum cleaner equipped with a high-efficiency particulate air (HEPA) filter.  The dust mite allergic individual should not be in a room which is being cleaned and should wait 1  hour after cleaning before going into the room. Do not sleep on upholstered furniture (eg, couches).   If possible removing carpeting, upholstered furniture and drapery from the home is ideal.  Horizontal blinds should be eliminated in the rooms where the person spends the most time (bedroom, study, television room).  Washable vinyl, roller-type shades are optimal. Remove all non-washable stuffed toys from the bedroom.  Wash stuffed toys weekly like sheets and blankets above.   Reduce indoor humidity to less than 50%.  Inexpensive humidity monitors can be purchased at most hardware stores.  Do not use a humidifier as can make the problem worse and are not recommended.  Allergy Shots  Allergies are the result of a chain reaction that starts in the immune system. Your immune system controls how your body defends itself. For instance, if you have an allergy to pollen, your immune system identifies pollen as an invader or allergen. Your immune system overreacts by producing antibodies called Immunoglobulin E (IgE). These antibodies travel to cells that release chemicals, causing an allergic reaction.  The concept behind allergy immunotherapy, whether it is received in the form of shots or tablets, is that the immune system can be desensitized to specific allergens that trigger allergy symptoms. Although it requires time and patience, the payback can be long-term relief. Allergy injections contain a dilute solution of those substances that you are allergic to based upon your skin testing and allergy history.   How Do Allergy Shots Work?  Allergy shots work much like a vaccine. Your body responds to injected amounts of a particular allergen given in increasing doses, eventually developing a resistance and tolerance  to it. Allergy shots can lead to decreased, minimal or no allergy symptoms.  There generally are two phases: build-up and maintenance. Build-up often ranges from three to six months and involves  receiving injections with increasing amounts of the allergens. The shots are typically given once or twice a week, though more rapid build-up schedules are sometimes used.  The maintenance phase begins when the most effective dose is reached. This dose is different for each person, depending on how allergic you are and your response to the build-up injections. Once the maintenance dose is reached, there are longer periods between injections, typically two to four weeks.  Occasionally doctors give cortisone-type shots that can temporarily reduce allergy symptoms. These types of shots are different and should not be confused with allergy immunotherapy shots.  Who Can Be Treated with Allergy Shots?  Allergy shots may be a good treatment approach for people with allergic rhinitis (hay fever), allergic asthma, conjunctivitis (eye allergy) or stinging insect allergy.   Before deciding to begin allergy shots, you should consider:   The length of allergy season and the severity of your symptoms  Whether medications and/or changes to your environment can control your symptoms  Your desire to avoid long-term medication use  Time: allergy immunotherapy requires a major time commitment  Cost: may vary depending on your insurance coverage  Allergy shots for children age 70 and older are effective and often well tolerated. They might prevent the onset of new allergen sensitivities or the progression to asthma.  Allergy shots are not started on patients who are pregnant but can be continued on patients who become pregnant while receiving them. In some patients with other medical conditions or who take certain common medications, allergy shots may be of risk. It is important to mention other medications you talk to your allergist.   What are the two types of build-ups offered:   RUSH or Rapid Desensitization -- one day of injections lasting from 8:30-4:30pm, injections every 1 hour.  Approximately half of  the build-up process is completed in that one day.  The following week, normal build-up is resumed, and this entails ~16 visits either weekly or twice weekly, until reaching your maintenance dose which is continued weekly until eventually getting spaced out to every month for a duration of 3 to 5 years. The regular build-up appointments are nurse visits where the injections are administered, followed by required monitoring for 30 minutes.    Traditional build-up -- weekly visits for 6 -12 months until reaching maintenance dose, then continue weekly until eventually spacing out to every 4 weeks as above. At these appointments, the injections are administered, followed by required monitoring for 30 minutes.     Either way is acceptable, and both are equally effective. With the rush protocol, the advantage is that less time is spent here for injections overall AND you would also reach maintenance dosing faster (which is when the clinical benefit starts to become more apparent). Not everyone is a candidate for rapid desensitization.   IF we proceed with the RUSH protocol, there are premedications which must be taken the day before and the day after the rush only (this includes antihistamines, steroids, and Singulair).  After the rush day, no prednisone  or Singulair is required, and we just recommend antihistamines taken on your injection day.  What Is An Estimate of the Costs?  If you are interested in starting allergy injections, please check with your insurance company about your coverage for both allergy vial sets  and allergy injections.  Please do so prior to making the appointment to start injections.  The following are CPT codes to give to your insurance company. These are the amounts we BILL to the insurance company, but the amount YOU WILL PAY and WE RECEIVE IS SUBSTANTIALLY LESS and depends on the contracts we have with different insurance companies.   Amount Billed to Insurance One allergy  vial set  CPT 95165   $ 1200     One injection   CPT 95115   $ 35  RUSH (Rapid Desensitization) CPT 95180 x 8 hours  $500/hour  Regarding the allergy injections, your co-pay may or may not apply with each injection, so please confirm this with your insurance company. When you start allergy injections, 1 or 2 sets of vials are made based on your allergies.  Not all patients can be on one set of vials. A set of vials lasts 6 months to a year depending on how quickly you can proceed with your build-up of your allergy injections. Vials are personalized for each patient depending on their specific allergens.  How often are allergy injection given during the build-up period?   Injections are given at least weekly during the build-up period until your maintenance dose is achieved. Per the doctor's discretion, you may have the option of getting allergy injections two times per week during the build-up period. However, there must be at least 48 hours between injections. The build-up period is usually completed within 6-12 months depending on your ability to schedule injections and for adjustments for reactions. When maintenance dose is reached, your injection schedule is gradually changed to every two weeks and later to every three weeks. Injections will then continue every 4 weeks. Usually, injections are continued for a total of 3-5 years.   When Will I Feel Better?  Some may experience decreased allergy symptoms during the build-up phase. For others, it may take as long as 12 months on the maintenance dose. If there is no improvement after a year of maintenance, your allergist will discuss other treatment options with you.  If you arent responding to allergy shots, it may be because there is not enough dose of the allergen in your vaccine or there are missing allergens that were not identified during your allergy testing. Other reasons could be that there are high levels of the allergen in your environment  or major exposure to non-allergic triggers like tobacco smoke.  What Is the Length of Treatment?  Once the maintenance dose is reached, allergy shots are generally continued for three to five years. The decision to stop should be discussed with your allergist at that time. Some people may experience a permanent reduction of allergy symptoms. Others may relapse and a longer course of allergy shots can be considered.  What Are the Possible Reactions?  The two types of adverse reactions that can occur with allergy shots are local and systemic. Common local reactions include very mild redness and swelling at the injection site, which can happen immediately or several hours after. Report a delayed reaction from your last injection. These include arm swelling or runny nose, watery eyes or cough that occurs within 12-24 hours after injection. A systemic reaction, which is less common, affects the entire body or a particular body system. They are usually mild and typically respond quickly to medications. Signs include increased allergy symptoms such as sneezing, a stuffy nose or hives.   Rarely, a serious systemic reaction called anaphylaxis can develop.  Symptoms include swelling in the throat, wheezing, a feeling of tightness in the chest, nausea or dizziness. Most serious systemic reactions develop within 30 minutes of allergy shots. This is why it is strongly recommended you wait in your doctors office for 30 minutes after your injections. Your allergist is trained to watch for reactions, and his or her staff is trained and equipped with the proper medications to identify and treat them.   Report to the nurse immediately if you experience any of the following symptoms: swelling, itching or redness of the skin, hives, watery eyes/nose, breathing difficulty, excessive sneezing, coughing, stomach pain, diarrhea, or light headedness. These symptoms may occur within 15-20 minutes after injection and may require  medication.   Who Should Administer Allergy Shots?  The preferred location for receiving shots is your prescribing allergists office. Injections can sometimes be given at another facility where the physician and staff are trained to recognize and treat reactions, and have received instructions by your prescribing allergist.  What if I am late for an injection?   Injection dose will be adjusted depending upon how many days or weeks you are late for your injection.   What if I am sick?   Please report any illness to the nurse before receiving injections. She may adjust your dose or postpone injections depending on your symptoms. If you have fever, flu, sinus infection or chest congestion it is best to postpone allergy injections until you are better. Never get an allergy injection if your asthma is causing you problems. If your symptoms persist, seek out medical care to get your health problem under control.  What If I am or Become Pregnant:  Women that become pregnant should schedule an appointment with [facility] before receiving any further allergy injections.

## 2024-12-24 NOTE — Progress Notes (Signed)
 "  FOLLOW UP  Date of Service/Encounter:  12/24/24   Assessment:   Perennial allergic rhinitis  (dog, dust mites) Productive cough   Peeling skin   Moderate persistent asthma, uncomplicated - planning to start Fasenra  Plan/Recommendations:   1. Productive cough - Chest X-ray was normal at the last visit. - We tried to get a chest CT done, but insurance denied it (we can try a peer-to-peer, but I think we can hold off right now).   2. Peeling skin - This seems to be improved.  - Continue with the triamcinolone ointment twice daily as needed for the peeling skin on the hands.   3. Chronic rhinitis - Testing was positive to dust mite and dog. - Avoidance measures provided.  - STRONGLY consider allergy shots for long-term control (we can mix your allergies into ONE vial, therefore only one injection).  - Consider rush immunotherapy to reach maintenance more quickly (we have an opening on FRI FEB 6th or we could do FEB 13th or FEB 20th). - Continue with Zyrtec  (cetirizine ) 10mg  twice daily. - Continue with the Singulair (montelukast) 10mg  daily. - Continue with the Flonase  (fluticasone ) one spray per nostril daily.   4. Moderate persistent asthma, uncomplicated  - Lung testing not done today.  - We are working on getting you started on Fasenra (consent signed at the last visit) - Daily controller medication(s): Symbicort  80/4.44mcg two puffs twice daily with spacer and Singulair (montelukast) at night - Prior to physical activity: albuterol  2 puffs 10-15 minutes before physical activity. - Rescue medications: albuterol  4 puffs every 4-6 hours as needed and albuterol  nebulizer one vial every 4-6 hours as needed - Asthma control goals:  * Full participation in all desired activities (may need albuterol  before activity) * Albuterol  use two time or less a week on average (not counting use with activity) * Cough interfering with sleep two time or less a month * Oral steroids no more  than once a year * No hospitalizations  5. Return in about 3 months (around 03/24/2025). You can have the follow up appointment with Dr. Iva or a Nurse Practicioner (our Nurse Practitioners are excellent and always have Physician oversight!).    Subjective:   Tom Humphrey is a 44 y.o. male presenting today for follow up of No chief complaint on file.   Tom Humphrey has a history of the following: Patient Active Problem List   Diagnosis Date Noted   Nephrolithiasis 06/07/2023   Cigarette smoker 02/01/2019   Allergic rhinitis 03/06/2014   History of cigar smoking 03/06/2014   GERD (gastroesophageal reflux disease) 03/06/2014    History obtained from: chart review and patient.  Discussed the use of AI scribe software for clinical note transcription with the patient and/or guardian, who gave verbal consent to proceed.  Unknown is a 44 y.o. male presenting for skin testing. He was last seen in early January 2026. We could not do testing because his insurance company does not cover testing on the same day as a New Patient visit. He has been off of all antihistamines 3 days in anticipation of the testing.   At his last visit in January 2026, we ordered a chest CT.  Unfortunately, this was denied and required a prior authorization.  For his peeling skin, it seemed to be improved.  We continue with triamcinolone twice daily as needed.  For his rhinitis, we decided to do environmental allergy testing.  We added on Zyrtec  and continue with Singulair  and Flonase .  For his asthma, lung testing looked a lot better.  He did sign a consent form for Fasenra.  We continue with Symbicort  80 mcg 2 puffs twice daily and Singulair.  Since last visit, he has done well. He did recently complete another round of steroids prescribed by his PCP. The steroids always helps. His coughing is much better. He does not have the cough any longer and is not having any mucosus when he coughs. He remains open to  trying the [place].   Otherwise, there have been no changes to his past medical history, surgical history, family history, or social history.    Review of systems otherwise negative other than that mentioned in the HPI.    Objective:   There were no vitals taken for this visit. There is no height or weight on file to calculate BMI.    Physical exam deferred since this was a skin testing appointment only.   Diagnostic studies:   Allergy Studies:     Airborne Adult Perc - 12/24/24 1441     Time Antigen Placed 1441    Allergen Manufacturer Jestine    Location Back    Number of Test 55    1. Control-Buffer 50% Glycerol Negative    2. Control-Histamine 2+    3. Bahia Negative    4. Bermuda Negative    5. Johnson Negative    6. [place]  Blue Negative    7. Meadow Fescue Negative    8. Perennial Rye Negative    9. Timothy Negative    10. Ragweed Mix Negative    11. Cocklebur Negative    12. Plantain,  English Negative    13. Baccharis Negative    14. Dog Fennel Negative    15. Russian Thistle Negative    16. Lamb's Quarters Negative    17. Sheep Sorrell Negative    18. Rough Pigweed Negative    19. Marsh Elder, Rough Negative    20. Mugwort, Common Negative    21. Box, Elder Negative    22. Cedar, red Negative    23. Sweet Gum Negative    24. Pecan Pollen Negative    25. Pine Mix Negative    26. Walnut, Black Pollen Negative    27. Red Mulberry Negative    28. Ash Mix Negative    29. Birch Mix Negative    30. Beech American Negative    31. Cottonwood, Eastern Negative    32. Hickory, White Negative    33. Maple Mix Negative    34. Oak, Eastern Mix Negative    35. Sycamore Eastern Negative    36. Alternaria Alternata Negative    37. Cladosporium Herbarum Negative    38. Aspergillus Mix Negative    39. Penicillium Mix Negative    40. Bipolaris Sorokiniana (Helminthosporium) Negative    41. Drechslera Spicifera (Curvularia) Negative    42. Mucor Plumbeus  Negative    43. Fusarium Moniliforme Negative    44. Aureobasidium Pullulans (pullulara) Negative    45. Rhizopus Oryzae Negative    46. Botrytis Cinera Negative    47. Epicoccum Nigrum Negative    48. Phoma Betae Negative    49. Dust Mite Mix Negative    50. Cat Hair 10,000 BAU/ml Negative    51.  Dog Epithelia Negative    52. Mixed Feathers Negative    53. Horse Epithelia Negative    54. Cockroach, German Negative    55. Tobacco Leaf Negative  Intradermal - 12/24/24 1514     Time Antigen Placed 1514    Allergen Manufacturer Jestine    Location Arm    Number of Test 16    Control Negative    Bahia Negative    Bermuda Negative    Johnson Negative    7 Grass Negative    Ragweed Mix Negative    Weed Mix Negative    Tree Mix Negative    Mold 1 Negative    Mold 2 Negative    Mold 3 Negative    Mold 4 Negative    Mite Mix 3+    Cat Negative    Dog 3+    Cockroach Negative    Other Omitted          Allergy testing results were read and interpreted by myself, documented by clinical staff.      Marty Shaggy, MD  [facility]        "

## 2024-12-26 NOTE — Addendum Note (Signed)
 Addended by: IVA MARTY SALTNESS on: 12/26/2024 05:02 PM   Modules accepted: Orders

## 2024-12-31 NOTE — Progress Notes (Signed)
 VIAL MADE ON 12/31/24

## 2024-12-31 NOTE — Progress Notes (Signed)
 Aeroallergen Immunotherapy  Ordering Provider: Marty Shaggy, MD  Patient Details Name: JUNIPER SNYDERS MRN: [id number] Date of Birth: [birth year]  Order 1 of 1  Vial Label: DM/Dog  1.0 ml (Volume)   AU Concentration -- Mite Mix (DF 5,000 & DP 5,000) 1.0 ml (Volume)  1:10 Concentration -- Dog Epithelia   2.0  ml Extract Subtotal 3.0  ml Diluent  5.0  ml Maintenance Total  Schedule:  B  Silver Vial (1:10,000): RUSH Green Vial (1:1,000): RUSH Blue Vial (1:100): RUSH Yellow Vial (1:10): Schedule B (6 doses) Red Vial (1:1): Schedule A (14 doses)  Special Instructions: After completion of the first Red Vial, please space to every two weeks. After completion of the second Red Vial, please space to every 4 weeks. Ok to up dose new vials on Schedule D. Ok to come twice weekly, if desired, as long as there is 48 hours between injections.

## 2025-01-02 ENCOUNTER — Telehealth: Payer: Self-pay | Admitting: *Deleted

## 2025-01-02 ENCOUNTER — Ambulatory Visit: Admitting: Allergy & Immunology

## 2025-01-02 NOTE — Telephone Encounter (Signed)
L/m for patient to contact me to advise approval and submit to [facility] for [employer]

## 2025-01-02 NOTE — Telephone Encounter (Signed)
-----   Message from Marty Shaggy, MD sent at 12/24/2024  4:08 PM EST ----- Jonell start - Consent signed earlier

## 2025-01-16 ENCOUNTER — Ambulatory Visit: Admitting: Allergy & Immunology
